# Patient Record
Sex: Female | Born: 1990 | Race: Black or African American | Hispanic: No | Marital: Single | Smoking: Current some day smoker
Health system: Southern US, Community
[De-identification: ages and names within clinical notes are randomized; demographics above are authoritative.]

## PROBLEM LIST (undated history)

## (undated) DIAGNOSIS — R0602 Shortness of breath: Secondary | ICD-10-CM

## (undated) DIAGNOSIS — B351 Tinea unguium: Secondary | ICD-10-CM

## (undated) DIAGNOSIS — M79676 Pain in unspecified toe(s): Secondary | ICD-10-CM

## (undated) DIAGNOSIS — M25559 Pain in unspecified hip: Secondary | ICD-10-CM

## (undated) DIAGNOSIS — R42 Dizziness and giddiness: Secondary | ICD-10-CM

## (undated) HISTORY — DX: Pain in unspecified toe(s): M79.676

## (undated) HISTORY — DX: Tinea unguium: B35.1

## (undated) HISTORY — DX: Pain in unspecified hip: M25.559

## (undated) HISTORY — DX: Dizziness and giddiness: R42

## (undated) HISTORY — DX: Shortness of breath: R06.02

---
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
## 2011-07-20 LAB — URINALYSIS, ROUTINE W REFLEX MICROSCOPIC
Bilirubin Urine: NEGATIVE
Glucose, UA: NEGATIVE
Hgb urine dipstick: NEGATIVE
Leukocytes, UA: NEGATIVE
Protein, ur: 30 — AB
pH: 6

## 2011-09-29 ENCOUNTER — Emergency Department (HOSPITAL_COMMUNITY)
Admission: EM | Admit: 2011-09-29 | Discharge: 2011-09-29 | Disposition: A | Discharge status: Home / Self Care | Payer: Medicaid Other | Attending: Emergency Medicine | Admitting: Emergency Medicine

## 2011-09-29 ENCOUNTER — Encounter: Payer: Self-pay | Admitting: Emergency Medicine

## 2011-09-29 ENCOUNTER — Emergency Department (HOSPITAL_COMMUNITY): Payer: Medicaid Other

## 2011-09-29 DIAGNOSIS — S92919A Unspecified fracture of unspecified toe(s), initial encounter for closed fracture: Secondary | ICD-10-CM | POA: Insufficient documentation

## 2011-09-29 DIAGNOSIS — M79609 Pain in unspecified limb: Secondary | ICD-10-CM | POA: Insufficient documentation

## 2011-09-29 DIAGNOSIS — W2209XA Striking against other stationary object, initial encounter: Secondary | ICD-10-CM | POA: Insufficient documentation

## 2011-09-29 MED ORDER — TRAMADOL HCL 50 MG PO TABS: 50.0000 mg | ORAL_TABLET | Freq: Four times a day (QID) | ORAL | Status: AC | PRN

## 2011-09-29 MED ORDER — HYDROCODONE-ACETAMINOPHEN 5-325 MG PO TABS
1.0000 | ORAL_TABLET | Freq: Once | ORAL | Status: AC
  Administered 2011-09-29: 1 via ORAL
  Filled 2011-09-29: qty 1

## 2011-09-29 NOTE — ED Notes (Signed)
Pt c/o left 4th toe pain; states that she stubbed her toe appx 30 mins pta and the nail split and ripped back

## 2011-09-29 NOTE — ED Notes (Signed)
Patient transported to X-ray 

## 2011-09-29 NOTE — ED Provider Notes (Signed)
History     CSN: [id number]  Arrival date & time 09/29/11  1515   First MD Initiated Contact with Patient 09/29/11 1540     4:13 PM HPI Patient reports she "stumped" her left 4th toe on a wall. 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 States that when her toe hit the wall her deformed toenail bent backwards and bled a little. States painful to ambulate and palpate toe.  Patient is a 20 y.o. female presenting with toe pain. The history is provided by the patient.  Toe Pain This is a new problem. The current episode started today. The problem occurs constantly. The problem has been unchanged. Pertinent negatives include no joint swelling, numbness or weakness. The symptoms are aggravated by walking and standing (palpation). She has tried nothing for the symptoms.    History reviewed. No pertinent past medical history.  History reviewed. No pertinent past surgical history.  No family history on file.  History  Substance Use Topics  . Smoking status: Never Smoker   . Smokeless tobacco: Not on file  . Alcohol Use: Yes     occasionally    OB History    Grav Para Term Preterm Abortions TAB SAB Ect Mult Living                  Review of Systems  Musculoskeletal: Negative for joint swelling.       Toe pain   Skin: Negative for color change and wound.  Neurological: Negative for weakness and numbness.  All other systems reviewed and are negative.    Allergies  Review of patient's allergies indicates no known allergies.  Home Medications  No current outpatient prescriptions on file.  BP 124/70  Pulse 80  Temp(Src) 97.9 F (36.6 C) (Oral)  Resp 16  SpO2 100%  LMP 09/19/2011  Physical Exam  Vitals reviewed. Constitutional: She is oriented to person, place, and time. Vital signs are normal. She appears well-developed and well-nourished. No distress.  HENT:  Head: Normocephalic and atraumatic.  Eyes: Pupils are equal, round, and reactive to light.  Neck:  Neck supple.  Pulmonary/Chest: Effort normal.  Musculoskeletal: Normal range of motion. She exhibits no edema.       Left 4th toe. Full ROM. TTP. Toenail is long and may have a fungal infection, however patient states her toenail has been like this since she was born. Unable to assess cap refill due to discoloration of toenail (that pt states is normal)  Neurological: She is alert and oriented to person, place, and time.  Skin: Skin is warm and dry. No rash noted. No erythema. No pallor.  Psychiatric: She has a normal mood and affect. Her behavior is normal.    ED Course  Procedures   MDM   Possible tuft fracture. Will place in postop shoe and refer to ortho if needed. Patient and family agree on plan      Thomasene Lot, [place] 09/29/11 [phone]

## 2011-09-30 NOTE — ED Provider Notes (Signed)
Medical screening examination/treatment/procedure(s) were performed by non-physician practitioner and as supervising physician I was immediately available for consultation/collaboration.   Glynn Octave, MD 09/30/11 0100

## 2012-01-04 ENCOUNTER — Other Ambulatory Visit: Payer: Self-pay

## 2012-01-04 ENCOUNTER — Emergency Department (HOSPITAL_COMMUNITY)
Admission: EM | Admit: 2012-01-04 | Discharge: 2012-01-04 | Disposition: A | Discharge status: Home / Self Care | Payer: Medicaid Other | Attending: Emergency Medicine | Admitting: Emergency Medicine

## 2012-01-04 ENCOUNTER — Emergency Department (HOSPITAL_COMMUNITY): Payer: Medicaid Other

## 2012-01-04 ENCOUNTER — Encounter (HOSPITAL_COMMUNITY): Payer: Self-pay | Admitting: *Deleted

## 2012-01-04 DIAGNOSIS — R079 Chest pain, unspecified: Secondary | ICD-10-CM | POA: Insufficient documentation

## 2012-01-04 DIAGNOSIS — R000 Tachycardia, unspecified: Secondary | ICD-10-CM | POA: Insufficient documentation

## 2012-01-04 DIAGNOSIS — R42 Dizziness and giddiness: Secondary | ICD-10-CM | POA: Insufficient documentation

## 2012-01-04 DIAGNOSIS — R0602 Shortness of breath: Secondary | ICD-10-CM | POA: Insufficient documentation

## 2012-01-04 NOTE — ED Notes (Signed)
After vitals, I made RN Morrie Sheldon aware she was ready to be seen and if an EKG was needed due to complaint. RN Morrie Sheldon advised she did not think so due to symptoms but to was going to triage her first and then let me know if an EKG was appropriate or not.

## 2012-01-04 NOTE — ED Notes (Signed)
Pt is up laughing in the room pt state at the other hospital pt states her heart is ok she wants lab work

## 2012-01-04 NOTE — ED Notes (Signed)
Pt.'s mother approached nurses' station to ask for "all the names of those who have taken care of my daughter." I advised that I was not able to provide that information but was once again happy to call my charge nurse. She did not understand why I was not able to get everyone's names out. I went ahead and called Diane, the charge nurse.

## 2012-01-04 NOTE — ED Provider Notes (Signed)
Medical screening examination/treatment/procedure(s) were performed by non-physician practitioner and as supervising physician I was immediately available for consultation/collaboration.   Emmanuella Mirante, MD 01/04/12 2101 

## 2012-01-04 NOTE — ED Provider Notes (Signed)
History     CSN: [id number]  Arrival date & time 01/04/12  1336   First MD Initiated Contact with Patient 01/04/12 1506      Chief Complaint  Patient presents with  . Dizziness  . Tachycardia    (Consider location/radiation/quality/duration/timing/severity/associated sxs/prior treatment) Patient is a 21 y.o. female presenting with chest pain. The history is provided by the patient and a parent.  Chest Pain Primary symptoms include shortness of breath, palpitations and dizziness. Pertinent negatives for primary symptoms include no fever and no fatigue.  The palpitations also occurred with dizziness and shortness of breath.   Dizziness does not occur with diaphoresis.   Pertinent negatives for associated symptoms include no diaphoresis.   Pt states she has had intermittent chest pain for the last 7 years. States they occur randomly, at rest. Denies pain when she is playing sports or when walking up the stairs. States when she gets pain, she gets short of breath, Her body states shaking, she gets dizzy. States symptoms last about a day, resolve on its own. Last episode 2 days ago. States she was seen in ER, had ecg done, which was negative. Was discharged home. Mother states " I need to know what is causing her pain in her heart, I am not comfortable sending her home." Pt denies current pain. Denies recent surgeries or travel. Denis cigaret smoking. Not on birth control. Denies being pregnant.    History reviewed. No pertinent past medical history.  History reviewed. No pertinent past surgical history.  No family history on file.  History  Substance Use Topics  . Smoking status: Never Smoker   . Smokeless tobacco: Not on file  . Alcohol Use: Yes     occasionally    OB History    Grav Para Term Preterm Abortions TAB SAB Ect Mult Living                  Review of Systems  Constitutional: Negative for fever, chills, diaphoresis and fatigue.  HENT: Negative.   Eyes:  Negative.   Respiratory: Positive for chest tightness and shortness of breath.   Cardiovascular: Positive for chest pain and palpitations. Negative for leg swelling.  Genitourinary: Negative.   Musculoskeletal: Negative.   Skin: Negative.   Neurological: Positive for dizziness.  Psychiatric/Behavioral: Negative.     Allergies  Review of patient's allergies indicates no known allergies.  Home Medications  No current outpatient prescriptions on file.  BP 125/70  Pulse 102  Temp(Src) 98.6 F (37 C) (Oral)  Resp 16  SpO2 100%  LMP 01/04/2012  Physical Exam  Nursing note and vitals reviewed. Constitutional: She is oriented to person, place, and time. She appears well-developed and well-nourished. No distress.  HENT:  Head: Normocephalic.  Eyes: Conjunctivae are normal.  Neck: Neck supple.  Cardiovascular: Normal rate, regular rhythm and normal heart sounds.   Pulmonary/Chest: Effort normal and breath sounds normal. No respiratory distress. She has no wheezes.  Abdominal: Soft. Bowel sounds are normal. She exhibits no distension. There is no tenderness.  Musculoskeletal: Normal range of motion. She exhibits no edema.  Neurological: She is alert and oriented to person, place, and time.  Skin: Skin is warm and dry.  Psychiatric: She has a normal mood and affect.    ED Course  Procedures (including critical care time)  Pt on exam not tachycardic, smiling, laughing, I do not think this is PE, bc pain free at present, normal Oxygen sat,  On my exam not tachycardic.  Pt low risk for a ACS. Will get ECG, CXR. Mother stated multiple times "I need to know what is wrong with my daughters heart." I went in with nurse Gar Gibbon to explain to mother that pt's pain has been there for 7 years, she needs to follow up with her primary care doctor after we get ECG and CXR done here and if they are normal. Explained that there are other things in Pt's chest that could be causeing her pain. Mother  stated "That is impossible... " To me and Gar Gibbon, She said " There is nothing in the left side of the chest other then her heart."  Did some pt education, which I guess pt did not take very well, she became angry, but agreed to get tests done here.    Date: 01/04/2012  Rate: 87  Rhythm: normal sinus rhythm  QRS Axis: normal  Intervals: normal  ST/T Wave abnormalities: normal  Conduction Disutrbances:none  Narrative Interpretation:   Old EKG Reviewed: none available  Dg Chest 2 View  01/04/2012  *RADIOLOGY REPORT*  Clinical Data: Dizziness and tachycardia  CHEST - 2 VIEW  Comparison: None.  Findings: Normal heart size.  Clear lungs.  No pneumothorax and no pleural effusion.  IMPRESSION: No active cardiopulmonary disease.  Original Report Authenticated By: Donavan Burnet, M.D.    4:20 PM CXR negative. ECG with no acute findings. Pt is currently cp free. Doubt PE PERC negative. I do think given pt's symptoms, she will need close follow up and more testing. Will refer to cardiology per mothers request. Mother reassured.   No diagnosis found.    MDM          Lottie Mussel, PA 01/04/12 1621

## 2012-01-04 NOTE — ED Notes (Signed)
Patient is resting comfortably. Denies pain at present. Reports chest pain, intermittent x 7 years. Last episode of chest pan 2 days ago. Mother very anxious about child having heart problem

## 2012-01-04 NOTE — Discharge Instructions (Signed)
Kylie Anderson's ECG and chest x-ray are normal. If pain recurs, try deep breathing, take tylenol/motrin for pain. If becomes severe seek medical help. Follow up with either primary care doctor or cardiologist as referred for further testsing and for Echo and Holter's monitor test as they seem appropriate.   Chest Pain (Nonspecific) It is often hard to give a specific diagnosis for the cause of chest pain. There is always a chance that your pain could be related to something serious, such as a heart attack or a blood clot in the lungs. You need to follow up with your caregiver for further evaluation. CAUSES   Heartburn.   Pneumonia or bronchitis.   Anxiety or stress.   Inflammation around your heart (pericarditis) or lung (pleuritis or pleurisy).   A blood clot in the lung.   A collapsed lung (pneumothorax). It can develop suddenly on its own (spontaneous pneumothorax) or from injury (trauma) to the chest.   Shingles infection (herpes zoster virus).  The chest wall is composed of bones, muscles, and cartilage. Any of these can be the source of the pain.  The bones can be bruised by injury.   The muscles or cartilage can be strained by coughing or overwork.   The cartilage can be affected by inflammation and become sore (costochondritis).  DIAGNOSIS  Lab tests or other studies, such as X-rays, electrocardiography, stress testing, or cardiac imaging, may be needed to find the cause of your pain.  TREATMENT   Treatment depends on what may be causing your chest pain. Treatment may include:   Acid blockers for heartburn.   Anti-inflammatory medicine.   Pain medicine for inflammatory conditions.   Antibiotics if an infection is present.   You may be advised to change lifestyle habits. This includes stopping smoking and avoiding alcohol, caffeine, and chocolate.   You may be advised to keep your head raised (elevated) when sleeping. This reduces the chance of acid going backward from  your stomach into your esophagus.   Most of the time, nonspecific chest pain will improve within 2 to 3 days with rest and mild pain medicine.  HOME CARE INSTRUCTIONS   If antibiotics were prescribed, take your antibiotics as directed. Finish them even if you start to feel better.   For the next few days, avoid physical activities that bring on chest pain. Continue physical activities as directed.   Do not smoke.   Avoid drinking alcohol.   Only take over-the-counter or prescription medicine for pain, discomfort, or fever as directed by your caregiver.   Follow your caregiver's suggestions for further testing if your chest pain does not go away.   Keep any follow-up appointments you made. If you do not go to an appointment, you could develop lasting (chronic) problems with pain. If there is any problem keeping an appointment, you must call to reschedule.  SEEK MEDICAL CARE IF:   You think you are having problems from the medicine you are taking. Read your medicine instructions carefully.   Your chest pain does not go away, even after treatment.   You develop a rash with blisters on your chest.  SEEK IMMEDIATE MEDICAL CARE IF:   You have increased chest pain or pain that spreads to your arm, neck, jaw, back, or abdomen.   You develop shortness of breath, an increasing cough, or you are coughing up blood.   You have severe back or abdominal pain, feel nauseous, or vomit.   You develop severe weakness, fainting, or chills.  You have a fever.  THIS IS AN EMERGENCY. Do not wait to see if the pain will go away. Get medical help at once. Call your local emergency services (911 in [place] Do not drive yourself to the hospital. MAKE SURE YOU:   Understand these instructions.   Will watch your condition.   Will get help right away if you are not doing well or get worse.  Document Released: 06/29/2005 Document Revised: 09/08/2011 Document Reviewed: 04/24/2008 [facility] Patient  Information 2012 [place], [place].  RESOURCE GUIDE  Dental Problems  Patients with Medicaid: [facility] [phone]                                           [phone] [address] Phone:  [phone]                                                  Phone:  [phone]  If unable to pay or uninsured, contact:  [facility] or [facility] to become qualified for the adult dental clinic.  Chronic Pain Problems Contact [facility]  [phone] Patients need to be referred by their primary care doctor.  Insufficient Money for Medicine Contact [employer]:  call "211" or [facility] [phone].  No Primary Care Doctor Call [facility]  [phone] Other agencies that provide inexpensive medical care    [facility]  [phone]    [facility]  6197317628    [facility]  [phone]    [facility]  5015843308    [facility]  [phone]    [facility]  [phone]  Psychological Services [facility]  [phone] [facility] Services  [phone] [facility]   [phone] (emergency services [phone])  Substance Abuse Resources [facility] Drug Services  [phone] [facility] (319)697-9080 [facility] [place] (662)828-1855 Floyd[facility] Flock (276)633-7061 Residential & Outpatient Substance Abuse Program  956-031-3248  Abuse/Neglect [facility] Child Abuse Hotline 2482231359 [facility] Child Abuse Hotline (905) 509-8335 (After Hours)  Emergency Shelter [facility] 857-524-6643  Maternity Homes Room at the [place] [facility] [place] (646)317-2736 [facility] (404)134-9401  MRSA Hotline #:   980-381-7434    [facility] Resources  [facility] [place]     [employer]                          [facility] Dept. [address] [place]                        [address]      [address]  Patrecia Pace  [facility] Phone:  [phone]                                   Phone:  [phone]                 Phone:  [place] Phone:  [place] [phone] (After Hours)

## 2012-01-04 NOTE — ED Notes (Signed)
During assessment, patient's mother and visitor requested to know more information about her care. I advised her that I was not aware of her current situation because she was not on my patient list but that I was more than happy to get the nurse and PA. She continued to be verbally aggressive demanding to know why I would not answer questions. I continued to reminder her I was not able to give information because I was not providing care, I was merely there to perform the EKG task. I concluded the conversation that I was more than happy to get the assigned nurse, PA assigned, and even the charge nurse. It seemed I was not able to please patient's mother regardless of my response.

## 2012-01-04 NOTE — ED Notes (Signed)
Pt states she was in college 2 days ago and started to feel dizzy and heart racing. Pt states she went to hospital 2days at the hospital and was dx with anxiety . Pt denies any symptoms at this time. Pt states she just want to be checked out

## 2012-02-13 ENCOUNTER — Ambulatory Visit: Payer: Medicaid Other | Admitting: Cardiology

## 2012-03-22 ENCOUNTER — Ambulatory Visit: Payer: Medicaid Other | Admitting: Cardiology

## 2012-05-07 ENCOUNTER — Emergency Department (HOSPITAL_COMMUNITY)
Admission: EM | Admit: 2012-05-07 | Discharge: 2012-05-07 | Disposition: A | Discharge status: Home Health | Payer: Medicaid Other | Attending: Emergency Medicine | Admitting: Emergency Medicine

## 2012-05-07 ENCOUNTER — Emergency Department (HOSPITAL_COMMUNITY): Payer: Medicaid Other

## 2012-05-07 ENCOUNTER — Encounter (HOSPITAL_COMMUNITY): Payer: Self-pay

## 2012-05-07 DIAGNOSIS — F411 Generalized anxiety disorder: Secondary | ICD-10-CM | POA: Insufficient documentation

## 2012-05-07 DIAGNOSIS — R079 Chest pain, unspecified: Secondary | ICD-10-CM | POA: Insufficient documentation

## 2012-05-07 DIAGNOSIS — M940 Chondrocostal junction syndrome [Tietze]: Secondary | ICD-10-CM | POA: Insufficient documentation

## 2012-05-07 LAB — CBC WITH DIFFERENTIAL/PLATELET
Basophils Relative: 1 % (ref 0–1)
Eosinophils Absolute: 0.1 10*3/uL (ref 0.0–0.7)
HCT: 40.1 % (ref 36.0–46.0)
Hemoglobin: 13.2 g/dL (ref 12.0–15.0)
Lymphs Abs: 2.8 10*3/uL (ref 0.7–4.0)
MCH: 28.7 pg (ref 26.0–34.0)
MCHC: 32.9 g/dL (ref 30.0–36.0)
MCV: 87.2 fL (ref 78.0–100.0)
Monocytes Absolute: 0.4 10*3/uL (ref 0.1–1.0)
Monocytes Relative: 7 % (ref 3–12)
Neutrophils Relative %: 40 % — ABNORMAL LOW (ref 43–77)
RBC: 4.6 MIL/uL (ref 3.87–5.11)

## 2012-05-07 LAB — POCT I-STAT, CHEM 8
Calcium, Ion: 1.29 mmol/L — ABNORMAL HIGH (ref 1.12–1.23)
Chloride: 104 mEq/L (ref 96–112)
Creatinine, Ser: 1 mg/dL (ref 0.50–1.10)
Glucose, Bld: 72 mg/dL (ref 70–99)
Hemoglobin: 14.3 g/dL (ref 12.0–15.0)
Potassium: 4.2 mEq/L (ref 3.5–5.1)

## 2012-05-07 NOTE — ED Notes (Signed)
Pt compalains of chest pain, on going sicne she started college, pt sts may be anxiety.

## 2012-05-07 NOTE — ED Provider Notes (Signed)
History   This chart was scribed for Cheri Guppy, MD by Shari Heritage. The patient was seen in room TR04C/TR04C. Patient's care was started at 1253.     CSN: [id number]  Arrival date & time 05/07/12  1253   First MD Initiated Contact with Patient 05/07/12 1651      Chief Complaint  Patient presents with  . Chest Pain  . Anxiety    (Consider location/radiation/quality/duration/timing/severity/associated sxs/prior treatment) HPI  Kylie Anderson is a 21 y.o. female who presents to the Emergency Department complaining of chest pain onset several hours ago. Patient describes the pain as pointy, sharp and heavy. Pain is now resolved. Associated symptoms include labored breathing and body shakes. Patient says that she has had these pain episodes intermittently since she was 21 years old. Patient denies cough, fever, or chills. There is no nausea or vomiting. No leg swelling or leg pain. Patient denies any recent travel or long car trips. Patient denies any history of heart problems. Patient says that her grandfather had similar pains, but was never diagnosed with a specific condition.   Past Medical History  Diagnosis Date  . Hip pain   . Toe pain   . Fungal infection of toenail   . Chest pain   . SOB (shortness of breath)   . Dizziness     No past surgical history on file.  No family history on file.  History  Substance Use Topics  . Smoking status: Never Smoker   . Smokeless tobacco: Not on file  . Alcohol Use: Yes     occasionally    OB History    Grav Para Term Preterm Abortions TAB SAB Ect Mult Living                  Review of Systems  Constitutional: Negative for fever and chills.  Respiratory: Positive for shortness of breath. Negative for cough.   Cardiovascular: Positive for chest pain. Negative for leg swelling.  Gastrointestinal: Negative for nausea and vomiting.    Allergies  Review of patient's allergies indicates no known allergies.  Home  Medications  No current outpatient prescriptions on file.  BP 106/70  Pulse 74  Temp 98.2 F (36.8 C) (Oral)  Resp 18  SpO2 99%  LMP 04/29/2012  Physical Exam  Cardiovascular: Normal rate and regular rhythm.   No murmur heard.      Pulses are normal.  Pulmonary/Chest: Effort normal and breath sounds normal. No respiratory distress. She has no wheezes. She has no rales.       Left parasternal, mild tenderness.    ED Course  Procedures (including critical care time) DIAGNOSTIC STUDIES: Oxygen Saturation is 99% on room air, normal by my interpretation.    COORDINATION OF CARE: 5:04pm- Patient informed of current plan for treatment and evaluation and agrees with plan at this time.    Labs Reviewed  CBC WITH DIFFERENTIAL - Abnormal; Notable for the following:    Neutrophils Relative 40 (*)     Lymphocytes Relative 51 (*)     All other components within normal limits  POCT I-STAT, CHEM 8 - Abnormal; Notable for the following:    Calcium, Ion 1.29 (*)     All other components within normal limits  POCT I-STAT TROPONIN I   Dg Chest 2 View  05/07/2012  *RADIOLOGY REPORT*  Clinical Data: Intermittent chest pain.  CHEST - 2 VIEW  Comparison: 01/31/2012.  Findings:  Cardiopericardial silhouette within normal limits. Mediastinal contours normal. Trachea  midline.  No airspace disease or effusion.  IMPRESSION: No active cardiopulmonary disease.  No interval change.  Original Report Authenticated By: Andreas Newport, M.D.     No diagnosis found.  ECG Normal sinus rhythm at 86 beats per minute. Normal axis. Normal intervals. Impression normal sinus rhythm with sinus arrhythmia  MDM  costochondritis      I personally performed the services described in this documentation, which was scribed in my presence. The recorded information has been reviewed and considered.     Cheri Guppy, MD 05/07/12 [phone]

## 2013-01-12 ENCOUNTER — Encounter (HOSPITAL_COMMUNITY): Payer: Self-pay | Admitting: Emergency Medicine

## 2013-01-12 ENCOUNTER — Emergency Department (HOSPITAL_COMMUNITY)
Admission: EM | Admit: 2013-01-12 | Discharge: 2013-01-12 | Disposition: A | Discharge status: Home / Self Care | Payer: Self-pay | Attending: Emergency Medicine | Admitting: Emergency Medicine

## 2013-01-12 ENCOUNTER — Emergency Department (HOSPITAL_COMMUNITY): Payer: Self-pay

## 2013-01-12 DIAGNOSIS — F17200 Nicotine dependence, unspecified, uncomplicated: Secondary | ICD-10-CM | POA: Insufficient documentation

## 2013-01-12 DIAGNOSIS — R071 Chest pain on breathing: Secondary | ICD-10-CM | POA: Insufficient documentation

## 2013-01-12 DIAGNOSIS — Z872 Personal history of diseases of the skin and subcutaneous tissue: Secondary | ICD-10-CM | POA: Insufficient documentation

## 2013-01-12 DIAGNOSIS — R0789 Other chest pain: Secondary | ICD-10-CM

## 2013-01-12 DIAGNOSIS — Z8679 Personal history of other diseases of the circulatory system: Secondary | ICD-10-CM | POA: Insufficient documentation

## 2013-01-12 DIAGNOSIS — Z8709 Personal history of other diseases of the respiratory system: Secondary | ICD-10-CM | POA: Insufficient documentation

## 2013-01-12 DIAGNOSIS — Z8739 Personal history of other diseases of the musculoskeletal system and connective tissue: Secondary | ICD-10-CM | POA: Insufficient documentation

## 2013-01-12 DIAGNOSIS — Z3202 Encounter for pregnancy test, result negative: Secondary | ICD-10-CM | POA: Insufficient documentation

## 2013-01-12 DIAGNOSIS — Z8669 Personal history of other diseases of the nervous system and sense organs: Secondary | ICD-10-CM | POA: Insufficient documentation

## 2013-01-12 LAB — URINE MICROSCOPIC-ADD ON

## 2013-01-12 LAB — URINALYSIS, ROUTINE W REFLEX MICROSCOPIC
Bilirubin Urine: NEGATIVE
Glucose, UA: NEGATIVE mg/dL
Hgb urine dipstick: NEGATIVE
Ketones, ur: NEGATIVE mg/dL
Nitrite: NEGATIVE
Protein, ur: NEGATIVE mg/dL
Specific Gravity, Urine: 1.021 (ref 1.005–1.030)
Urobilinogen, UA: 1 mg/dL (ref 0.0–1.0)
pH: 6.5 (ref 5.0–8.0)

## 2013-01-12 LAB — POCT I-STAT, CHEM 8
Chloride: 107 mEq/L (ref 96–112)
HCT: 40 % (ref 36.0–46.0)
Hemoglobin: 13.6 g/dL (ref 12.0–15.0)
Potassium: 3.9 mEq/L (ref 3.5–5.1)
Sodium: 139 mEq/L (ref 135–145)

## 2013-01-12 LAB — D-DIMER, QUANTITATIVE: D-Dimer, Quant: 0.41 ug/mL-FEU (ref 0.00–0.48)

## 2013-01-12 LAB — PREGNANCY, URINE: Preg Test, Ur: NEGATIVE

## 2013-01-12 MED ORDER — KETOROLAC TROMETHAMINE 60 MG/2ML IM SOLN
60.0000 mg | Freq: Once | INTRAMUSCULAR | Status: AC
  Administered 2013-01-12: 60 mg via INTRAMUSCULAR
  Filled 2013-01-12: qty 2

## 2013-01-12 MED ORDER — IBUPROFEN 800 MG PO TABS: 800.0000 mg | ORAL_TABLET | Freq: Three times a day (TID) | ORAL | Status: DC | PRN

## 2013-01-12 MED ORDER — HYDROCODONE-ACETAMINOPHEN 5-325 MG PO TABS
1.0000 | ORAL_TABLET | Freq: Once | ORAL | Status: AC
  Administered 2013-01-12: 1 via ORAL
  Filled 2013-01-12: qty 1

## 2013-01-12 MED ORDER — HYDROCODONE-ACETAMINOPHEN 5-325 MG PO TABS: 1.0000 | ORAL_TABLET | Freq: Four times a day (QID) | ORAL | Status: DC | PRN

## 2013-01-12 NOTE — ED Provider Notes (Signed)
History     CSN: [id number]  Arrival date & time 01/12/13  1409   First MD Initiated Contact with Patient 01/12/13 [phone]      Chief Complaint  Patient presents with  . Chest Pain    (Consider location/radiation/quality/duration/timing/severity/associated sxs/prior treatment) HPI  The patient presents to the emergency department with left-sided chest pain.  Patient, states, that she woke up this morning with the pain.  Patient, states, that she's had this type of pain in the past and was seen in the emergency department.  Patient, states she occasionally smokes, but not on a consistent basis.  Patient denies shortness of breath, headache, abdominal pain, nausea, vomiting, diarrhea, blurred vision, neck pain, dizziness, or syncope.  Patient, states, that she doesn't understand why we can't tell her what is causing her pain.  Patient, states she has had this type of chest pain in the past Past Medical History  Diagnosis Date  . Hip pain   . Toe pain   . Fungal infection of toenail   . Chest pain   . SOB (shortness of breath)   . Dizziness     History reviewed. No pertinent past surgical history.  No family history on file.  History  Substance Use Topics  . Smoking status: Current Every Day Smoker  . Smokeless tobacco: Not on file  . Alcohol Use: Yes     Comment: occasionally    OB History   Grav Para Term Preterm Abortions TAB SAB Ect Mult Living                  Review of Systems All other systems negative except as documented in the HPI. All pertinent positives and negatives as reviewed in the HPI. Allergies  Review of patient's allergies indicates no known allergies.  Home Medications   Current Outpatient Rx  Name  Route  Sig  Dispense  Refill  . ibuprofen (ADVIL,MOTRIN) 200 MG tablet   Oral   Take 200 mg by mouth every 6 (six) hours as needed for pain.           BP 122/78  Pulse 89  Temp(Src) 98.6 F (37 C) (Oral)  Resp 15  SpO2 100%  LMP  12/17/2012  Physical Exam  Nursing note and vitals reviewed. Constitutional: She is oriented to person, place, and time. She appears well-developed and well-nourished.  HENT:  Head: Normocephalic and atraumatic.  Mouth/Throat: Oropharynx is clear and moist.  Eyes: Pupils are equal, round, and reactive to light.  Neck: Normal range of motion. Neck supple.  Cardiovascular: Normal rate, regular rhythm and normal heart sounds.  Exam reveals no gallop and no friction rub.   No murmur heard. Pulmonary/Chest: Effort normal and breath sounds normal. No respiratory distress. She exhibits tenderness.  Abdominal: Soft. Bowel sounds are normal. She exhibits no distension. There is no tenderness. There is no guarding.  Neurological: She is alert and oriented to person, place, and time. Coordination normal.  Skin: Skin is warm and dry. No rash noted.    ED Course  Procedures (including critical care time)  Labs Reviewed  URINALYSIS, ROUTINE W REFLEX MICROSCOPIC - Abnormal; Notable for the following:    APPearance CLOUDY (*)    Leukocytes, UA SMALL (*)    All other components within normal limits  URINE MICROSCOPIC-ADD ON - Abnormal; Notable for the following:    Squamous Epithelial / LPF FEW (*)    Bacteria, UA MANY (*)    All other components within normal  limits  URINE CULTURE  PREGNANCY, URINE  D-DIMER, QUANTITATIVE  POCT I-STAT, CHEM 8   Dg Chest 2 View  01/12/2013  *RADIOLOGY REPORT*  Clinical Data: Pain in the chest.  CHEST - 2 VIEW  Comparison: 05/07/2012  Findings: Two views of the chest demonstrate clear lungs.  Heart and mediastinum are within normal limits.  The trachea is midline. Bony thorax is intact.  IMPRESSION: No active cardiopulmonary disease.   Original Report Authenticated By: Richarda Overlie, M.D.    Patient be treated for chest wall pain, based on her history of present illness , and physical exam findings.  Patient has been seen in the past for similar chest pain.  I went  and advised the patient of her test results, and she is irritated, that we cannot clearly define, why she's having chest pain, other than telling her that it is her chest wall.  The mother is in the room and asked that we refer her to a cardiologist.  Patient does not have any cardiac risk factors and is PERC negative.     MDM  MDM Reviewed: vitals and nursing note Interpretation: labs, x-ray and ECG           Carlyle Dolly, PA-C 01/14/13 0116

## 2013-01-12 NOTE — ED Notes (Signed)
Pt c/o of centralized chest pain that has been occuring since she was 22 y/o. States that she has gone to numerous MDs, unable to determine cause. Thinks it may be stress related. SOB upon onset. NAD at this time.

## 2013-01-13 LAB — URINE CULTURE

## 2013-01-14 NOTE — ED Provider Notes (Signed)
Medical screening examination/treatment/procedure(s) were performed by non-physician practitioner and as supervising physician I was immediately available for consultation/collaboration.  Teyona Nichelson R. Scott Vanderveer, MD 01/14/13 0300 

## 2013-09-08 ENCOUNTER — Encounter (HOSPITAL_COMMUNITY): Payer: Self-pay | Admitting: Emergency Medicine

## 2013-09-08 ENCOUNTER — Emergency Department (HOSPITAL_COMMUNITY)
Admission: EM | Admit: 2013-09-08 | Discharge: 2013-09-08 | Disposition: A | Discharge status: Home / Self Care | Payer: Medicaid Other | Attending: Emergency Medicine | Admitting: Emergency Medicine

## 2013-09-08 DIAGNOSIS — T148 Other injury of unspecified body region: Secondary | ICD-10-CM | POA: Insufficient documentation

## 2013-09-08 DIAGNOSIS — Y939 Activity, unspecified: Secondary | ICD-10-CM | POA: Insufficient documentation

## 2013-09-08 DIAGNOSIS — F17200 Nicotine dependence, unspecified, uncomplicated: Secondary | ICD-10-CM | POA: Insufficient documentation

## 2013-09-08 DIAGNOSIS — Y929 Unspecified place or not applicable: Secondary | ICD-10-CM | POA: Insufficient documentation

## 2013-09-08 DIAGNOSIS — W57XXXA Bitten or stung by nonvenomous insect and other nonvenomous arthropods, initial encounter: Secondary | ICD-10-CM | POA: Insufficient documentation

## 2013-09-08 DIAGNOSIS — L299 Pruritus, unspecified: Secondary | ICD-10-CM | POA: Insufficient documentation

## 2013-09-08 MED ORDER — LORATADINE 10 MG PO TABS: 10.0000 mg | ORAL_TABLET | Freq: Every day | ORAL | Status: DC

## 2013-09-08 MED ORDER — PREDNISONE 20 MG PO TABS
60.0000 mg | ORAL_TABLET | Freq: Once | ORAL | Status: AC
  Administered 2013-09-08: 60 mg via ORAL
  Filled 2013-09-08: qty 3

## 2013-09-08 MED ORDER — HYDROXYZINE HCL 10 MG PO TABS: 10.0000 mg | ORAL_TABLET | Freq: Three times a day (TID) | ORAL | Status: DC | PRN

## 2013-09-08 MED ORDER — PREDNISONE 10 MG PO TABS: 20.0000 mg | ORAL_TABLET | Freq: Every day | ORAL | Status: DC

## 2013-09-08 NOTE — ED Provider Notes (Signed)
CSN: [id number]     Arrival date & time 09/08/13  1935 History  This chart was scribed for Marlon Pel, PA-C, working with Vida Roller, MD by Blanchard Kelch, ED Scribe. This patient was seen in room TR07C/TR07C and the patient's care was started at 8:10 PM.    Chief Complaint  Patient presents with  . Insect Bite    The history is provided by the patient. No language interpreter was used.    HPI Comments: Kylie Anderson is a 22 y.o. female who presents to the Emergency Department complaining of an insect bite to her right hand that occurred three days ago. She is complaining of burning pain to the associated area. She also reports itching sensation that has spread to her entire body over the past few days. She has been taking two Benadryl with temporary relief for an hour. Her last dose was about eight hours ago. She denies any wheezing, shortness of breath, throat closing or facial swelling.   Past Medical History  Diagnosis Date  . Hip pain   . Toe pain   . Fungal infection of toenail   . Chest pain   . SOB (shortness of breath)   . Dizziness    History reviewed. No pertinent past surgical history. No family history on file. History  Substance Use Topics  . Smoking status: Current Every Day Smoker  . Smokeless tobacco: Not on file  . Alcohol Use: Yes     Comment: occasionally   OB History   Grav Para Term Preterm Abortions TAB SAB Ect Mult Living                 Review of Systems  HENT: Negative for facial swelling and trouble swallowing.   Respiratory: Negative for shortness of breath and wheezing.   Skin:       Positive for insect bite.  All other systems reviewed and are negative.    Allergies  Review of patient's allergies indicates no known allergies.  Home Medications   Current Outpatient Rx  Name  Route  Sig  Dispense  Refill  . diphenhydrAMINE (BENADRYL) 25 MG tablet   Oral   Take 25 mg by mouth every 6 (six) hours as needed for itching.          . hydrOXYzine (ATARAX/VISTARIL) 10 MG tablet   Oral   Take 1 tablet (10 mg total) by mouth 3 (three) times daily as needed.   30 tablet   0   . loratadine (CLARITIN) 10 MG tablet   Oral   Take 1 tablet (10 mg total) by mouth daily.   14 tablet   0   . predniSONE (DELTASONE) 10 MG tablet   Oral   Take 2 tablets (20 mg total) by mouth daily.   21 tablet   0     Prednisone dose pack directions:   6 tabs on day ...    Triage Vitals: BP 118/71  Pulse 76  Temp(Src) 98.4 F (36.9 C) (Oral)  Resp 16  Ht 5\' 3"  (1.6 m)  Wt 151 lb (68.493 kg)  BMI 26.76 kg/m2  SpO2 99%  LMP 08/27/2013  Physical Exam  Nursing note and vitals reviewed. Constitutional: She is oriented to person, place, and time. She appears well-developed and well-nourished. No distress.  Patient itching during exam.  HENT:  Head: Normocephalic and atraumatic.  Eyes: EOM are normal.  Neck: Neck supple. No tracheal deviation present.  Cardiovascular: Normal rate.   Pulmonary/Chest: Effort  normal. No respiratory distress.  Musculoskeletal: Normal range of motion.  Neurological: She is alert and oriented to person, place, and time.  Skin: Skin is warm and dry.  Small pinpoint red dot on patient's right palm that has no surrounding erythema or induration. It is not painful to touch.  Psychiatric: She has a normal mood and affect. Her behavior is normal.    ED Course  Procedures (including critical care time)  DIAGNOSTIC STUDIES: Oxygen Saturation is 99% on room air, normal by my interpretation.    COORDINATION OF CARE: 8:10 PM -Will order medication to stop allergic reaction. Patient verbalizes understanding and agrees with treatment plan.    Labs Review Labs Reviewed - No data to display Imaging Review No results found.  EKG Interpretation   None       MDM   1. Insect bite   2. Itching    22 y.o.Kylie Anderson's evaluation in the Emergency Department is complete. It has been  determined that no acute conditions requiring further emergency intervention are present at this time. The patient/guardian have been advised of the diagnosis and plan. We have discussed signs and symptoms that warrant return to the ED, such as changes or worsening in symptoms.  Vital signs are stable at discharge. Filed Vitals:   09/08/13 1938  BP: 118/71  Pulse: 76  Temp: 98.4 F (36.9 C)  Resp: 16    Patient/guardian has voiced understanding and agreed to follow-up with the PCP or specialist.  I personally performed the services described in this documentation, which was scribed in my presence. The recorded information has been reviewed and is accurate.    Dorthula Matas, PA-C 09/08/13 2015

## 2013-09-08 NOTE — ED Notes (Signed)
PA Tiffany at bedside.  

## 2013-09-08 NOTE — ED Notes (Signed)
C/o ? Insect bite to R palm that she noticed today. C/o itching all over since Friday.  Denies sob.

## 2013-09-09 ENCOUNTER — Emergency Department (HOSPITAL_COMMUNITY)
Admission: EM | Admit: 2013-09-09 | Discharge: 2013-09-09 | Disposition: A | Discharge status: Home / Self Care | Payer: Medicaid Other | Attending: Emergency Medicine | Admitting: Emergency Medicine

## 2013-09-09 ENCOUNTER — Encounter (HOSPITAL_COMMUNITY): Payer: Self-pay | Admitting: Emergency Medicine

## 2013-09-09 DIAGNOSIS — Y929 Unspecified place or not applicable: Secondary | ICD-10-CM | POA: Insufficient documentation

## 2013-09-09 DIAGNOSIS — IMO0001 Reserved for inherently not codable concepts without codable children: Secondary | ICD-10-CM | POA: Insufficient documentation

## 2013-09-09 DIAGNOSIS — F411 Generalized anxiety disorder: Secondary | ICD-10-CM | POA: Insufficient documentation

## 2013-09-09 DIAGNOSIS — L299 Pruritus, unspecified: Secondary | ICD-10-CM | POA: Insufficient documentation

## 2013-09-09 DIAGNOSIS — Z8619 Personal history of other infectious and parasitic diseases: Secondary | ICD-10-CM | POA: Insufficient documentation

## 2013-09-09 DIAGNOSIS — R450 Nervousness: Secondary | ICD-10-CM | POA: Insufficient documentation

## 2013-09-09 DIAGNOSIS — T6391XA Toxic effect of contact with unspecified venomous animal, accidental (unintentional), initial encounter: Secondary | ICD-10-CM | POA: Insufficient documentation

## 2013-09-09 DIAGNOSIS — Y939 Activity, unspecified: Secondary | ICD-10-CM | POA: Insufficient documentation

## 2013-09-09 DIAGNOSIS — T63481A Toxic effect of venom of other arthropod, accidental (unintentional), initial encounter: Secondary | ICD-10-CM | POA: Insufficient documentation

## 2013-09-09 DIAGNOSIS — F17200 Nicotine dependence, unspecified, uncomplicated: Secondary | ICD-10-CM | POA: Insufficient documentation

## 2013-09-09 DIAGNOSIS — IMO0002 Reserved for concepts with insufficient information to code with codable children: Secondary | ICD-10-CM | POA: Insufficient documentation

## 2013-09-09 DIAGNOSIS — Z79899 Other long term (current) drug therapy: Secondary | ICD-10-CM | POA: Insufficient documentation

## 2013-09-09 MED ORDER — FAMOTIDINE 20 MG PO TABS
20.0000 mg | ORAL_TABLET | Freq: Once | ORAL | Status: AC
  Administered 2013-09-09: 20 mg via ORAL
  Filled 2013-09-09: qty 1

## 2013-09-09 MED ORDER — ACETAMINOPHEN 325 MG PO TABS
650.0000 mg | ORAL_TABLET | Freq: Once | ORAL | Status: AC
  Administered 2013-09-09: 650 mg via ORAL
  Filled 2013-09-09: qty 2

## 2013-09-09 NOTE — ED Notes (Signed)
Pt saw pcp today for allergic reaction--burning all over; given prednisone but did not get it filled; continues with burning and pain

## 2013-09-09 NOTE — ED Notes (Signed)
Pt e-signed d/c in room however computer froze.

## 2013-09-09 NOTE — ED Provider Notes (Signed)
CSN: [id number]     Arrival date & time 09/09/13  0251 History   First MD Initiated Contact with Patient 09/09/13 0325     Chief Complaint  Patient presents with  . Allergic Reaction   (Consider location/radiation/quality/duration/timing/severity/associated sxs/prior Treatment) HPI Comments: Patient was bitten on the hand on Friday than developed generalized itching Denies SOB throat tightening  Was seen at [facility] several hours ago given Prednisone and Vistaril and DC home  With Rx for steroid and Vistaril  Which she has not filled yet  Now wants to be tested for the specific bug that it her and wants her system flushed out   Patient is a 22 y.o. female presenting with allergic reaction. The history is provided by the patient and a parent.  Allergic Reaction Presenting symptoms: itching   Presenting symptoms: no difficulty breathing, no difficulty swallowing, no rash and no wheezing   Severity:  Moderate Prior allergic episodes:  No prior episodes Relieved by:  Nothing Worsened by:  Nothing tried Ineffective treatments:  None tried   Past Medical History  Diagnosis Date  . Hip pain   . Toe pain   . Fungal infection of toenail   . Chest pain   . SOB (shortness of breath)   . Dizziness    History reviewed. No pertinent past surgical history. No family history on file. History  Substance Use Topics  . Smoking status: Current Every Day Smoker  . Smokeless tobacco: Not on file  . Alcohol Use: Yes     Comment: occasionally   OB History   Grav Para Term Preterm Abortions TAB SAB Ect Mult Living                 Review of Systems  Constitutional: Negative for fever and chills.  HENT: Negative for drooling, sore throat and trouble swallowing.   Respiratory: Negative for shortness of breath and wheezing.   Gastrointestinal: Negative for nausea and abdominal pain.  Musculoskeletal: Positive for myalgias.  Skin: Positive for itching. Negative for rash and wound.   Neurological: Negative for dizziness and headaches.  Psychiatric/Behavioral: The patient is nervous/anxious.   All other systems reviewed and are negative.    Allergies  Review of patient's allergies indicates no known allergies.  Home Medications   Current Outpatient Rx  Name  Route  Sig  Dispense  Refill  . diphenhydrAMINE (BENADRYL) 25 MG tablet   Oral   Take 25 mg by mouth every 6 (six) hours as needed for itching.         . hydrOXYzine (ATARAX/VISTARIL) 10 MG tablet   Oral   Take 1 tablet (10 mg total) by mouth 3 (three) times daily as needed.   30 tablet   0   . loratadine (CLARITIN) 10 MG tablet   Oral   Take 1 tablet (10 mg total) by mouth daily.   14 tablet   0   . predniSONE (DELTASONE) 10 MG tablet   Oral   Take 2 tablets (20 mg total) by mouth daily.   21 tablet   0     Prednisone dose pack directions:   6 tabs on day ...    LMP 08/27/2013 Physical Exam  Nursing note and vitals reviewed. Constitutional: She is oriented to person, place, and time. She appears well-developed and well-nourished.  HENT:  Head: Normocephalic.  Mouth/Throat: Oropharynx is clear and moist.  Eyes: Pupils are equal, round, and reactive to light.  Neck: Normal range of motion.  Cardiovascular: Normal rate and regular rhythm.   Pulmonary/Chest: Effort normal and breath sounds normal. She has no wheezes.  Abdominal: Soft. She exhibits no distension.  Musculoskeletal: Normal range of motion.  Lymphadenopathy:    She has no cervical adenopathy.  Neurological: She is alert and oriented to person, place, and time.  Skin: Skin is warm and dry. No rash noted. There is erythema.    ED Course  Procedures (including critical care time) Labs Review Labs Reviewed - No data to display Imaging Review No results found.  EKG Interpretation   None       MDM   1. Allergic reaction to insect sting, subsequent encounter     Allergic reaction Explained there is no test  for a specific bug, she is to try to stay cool, take the medications as directed until completed.     Arman Filter, NP 09/09/13 [phone]

## 2013-09-09 NOTE — ED Provider Notes (Signed)
Medical screening examination/treatment/procedure(s) were performed by non-physician practitioner and as supervising physician I was immediately available for consultation/collaboration.   Sunnie Nielsen, MD 09/09/13 [phone]

## 2013-09-10 NOTE — ED Provider Notes (Signed)
Medical screening examination/treatment/procedure(s) were performed by non-physician practitioner and as supervising physician I was immediately available for consultation/collaboration.    Vida Roller, MD 09/10/13 [phone]

## 2014-01-18 ENCOUNTER — Encounter (HOSPITAL_COMMUNITY): Payer: Self-pay | Admitting: Emergency Medicine

## 2014-01-18 ENCOUNTER — Emergency Department (HOSPITAL_COMMUNITY)
Admission: EM | Admit: 2014-01-18 | Discharge: 2014-01-18 | Disposition: A | Discharge status: Home / Self Care | Payer: Medicaid Other | Attending: Emergency Medicine | Admitting: Emergency Medicine

## 2014-01-18 DIAGNOSIS — IMO0002 Reserved for concepts with insufficient information to code with codable children: Secondary | ICD-10-CM | POA: Insufficient documentation

## 2014-01-18 DIAGNOSIS — F17200 Nicotine dependence, unspecified, uncomplicated: Secondary | ICD-10-CM | POA: Insufficient documentation

## 2014-01-18 DIAGNOSIS — N644 Mastodynia: Secondary | ICD-10-CM | POA: Insufficient documentation

## 2014-01-18 DIAGNOSIS — Z79899 Other long term (current) drug therapy: Secondary | ICD-10-CM | POA: Insufficient documentation

## 2014-01-18 DIAGNOSIS — Z8619 Personal history of other infectious and parasitic diseases: Secondary | ICD-10-CM | POA: Insufficient documentation

## 2014-01-18 NOTE — ED Notes (Signed)
The pt is c/o lt breast pain for 4 days.  No known injury  No nipple discharge no drainage no redness no previous ghistory

## 2014-01-18 NOTE — ED Provider Notes (Signed)
CSN: [id number]     Arrival date & time 01/18/14  1551 History  This chart was scribed for non-physician practitioner working with Hilario Quarryanielle S Ray, MD by Elveria Risingimelie Horne, ED Scribe. This patient was seen in room TR08C/TR08C and the patient's care was started at 5:01 PM.   Chief Complaint  Patient presents with  . Breast Pain      The history is provided by the patient. No language interpreter was used.   HPI Comments: Darnell Levelngelique Kunzler is a 23 y.o. female who presents to the Emergency Department complaining of sharp left breast pain that radiates to the nipple, onset three days ago. Patient says that it initially presented once or twice at night. However yesterday it occurred repeatedly throughout the day. Today she attempted to take a warm shower to ease the pain, but it remained. Patient reports that her last episode occurred 30 minutes before evaluation. Patient has not taken any pain medication. Patient denies fever, discharge from the nipple.  Denies CP and SOB. Patient denies causative trauma or injury. Patient denies family history of breast cancer and previous history of breast pain.   Past Medical History  Diagnosis Date  . Hip pain   . Toe pain   . Fungal infection of toenail   . Chest pain   . SOB (shortness of breath)   . Dizziness    History reviewed. No pertinent past surgical history. No family history on file. History  Substance Use Topics  . Smoking status: Current Every Day Smoker  . Smokeless tobacco: Not on file  . Alcohol Use: Yes     Comment: occasionally   OB History   Grav Para Term Preterm Abortions TAB SAB Ect Mult Living                 Review of Systems  Constitutional: Negative for fever and chills.  Respiratory: Negative for shortness of breath.   Cardiovascular: Negative for chest pain.  Musculoskeletal: Positive for myalgias.       Breast pain.   Skin: Negative for rash.      Allergies  Review of patient's allergies indicates no known  allergies.  Home Medications   Prior to Admission medications   Medication Sig Start Date End Date Taking? Authorizing Provider  diphenhydrAMINE (BENADRYL) 25 MG tablet Take 25 mg by mouth every 6 (six) hours as needed for itching.    Historical Provider, MD  hydrOXYzine (ATARAX/VISTARIL) 10 MG tablet Take 1 tablet (10 mg total) by mouth 3 (three) times daily as needed. 09/08/13   Tiffany Irine SealG Greene, PA-C  loratadine (CLARITIN) 10 MG tablet Take 1 tablet (10 mg total) by mouth daily. 09/08/13   Tiffany Irine SealG Greene, PA-C  predniSONE (DELTASONE) 10 MG tablet Take 2 tablets (20 mg total) by mouth daily. 09/08/13   Dorthula Matasiffany G Greene, PA-C   Triage Vitals: BP 115/80  Pulse 98  Temp(Src) 98 F (36.7 C) (Oral)  Resp 18  Wt 160 lb 4 oz (72.689 kg)  SpO2 99%  LMP 01/18/2014 Physical Exam  Nursing note and vitals reviewed. Constitutional: She is oriented to person, place, and time. She appears well-developed and well-nourished. No distress.  HENT:  Head: Normocephalic and atraumatic.  Eyes: EOM are normal.  Neck: Neck supple. No tracheal deviation present.  Cardiovascular: Normal rate.   Pulmonary/Chest: Effort normal. No respiratory distress.  Breast exam chaperoned by Albin Fellingarla, RN.    Remarkable for fibrocystic breasts, no nodules, no skin changes, no nipple discharge  Musculoskeletal: Normal  range of motion.  Neurological: She is alert and oriented to person, place, and time.  Skin: Skin is warm and dry.  Psychiatric: She has a normal mood and affect. Her behavior is normal.    ED Course  Procedures (including critical care time) DIAGNOSTIC STUDIES: Oxygen Saturation is 99% on room air, normal by my interpretation.    COORDINATION OF CARE: 5:05 PM- Will refer to [facility]. Discussed treatment plan with patient at bedside and patient agreed to plan.     Labs Review Labs Reviewed - No data to display  Imaging Review No results found.   EKG Interpretation None      MDM    Final diagnoses:  Breast pain    Patient with breast pain, will discharge to home with [facility] follow-up next week.  Patient understands and agrees with the plan.  She is stable and ready for discharge.  Recommend tylenol and ibuprofen for pain.  I personally performed the services described in this documentation, which was scribed in my presence. The recorded information has been reviewed and is accurate.     Roxy Horsemanobert Tonji Elliff, PA-C 01/18/14 1714

## 2014-01-18 NOTE — Discharge Instructions (Signed)
Breast Tenderness Breast tenderness is a common problem for women of all ages. Breast tenderness may cause mild discomfort to severe pain. It has a variety of causes. Your health care provider will find out the likely cause of your breast tenderness by examining your breasts, asking you about symptoms, and ordering some tests. Breast tenderness usually does not mean you have breast cancer. HOME CARE INSTRUCTIONS  Breast tenderness often can be handled at home. You can try:  Getting fitted for a new bra that provides more support, especially during exercise.  Wearing a more supportive bra or sports bra while sleeping when your breasts are very tender.  If you have a breast injury, apply ice to the area:  Put ice in a plastic bag.  Place a towel between your skin and the bag.  Leave the ice on for 20 minutes, 2 3 times a day.  If your breasts are too full of milk as a result of breastfeeding, try:  Expressing milk either by hand or with a breast pump.  Applying a warm compress to the breasts for relief.  Taking over-the-counter pain relievers, if approved by your health care provider.  Taking other medicines that your health care provider prescribes. These may include antibiotic medicines or birth control pills. Over the long term, your breast tenderness might be eased if you:  Cut down on caffeine.  Reduce the amount of fat in your diet. Keep a log of the days and times when your breasts are most tender. This will help you and your health care provider find the cause of the tenderness and how to relieve it. Also, learn how to do breast exams at home. This will help you notice if you have an unusual growth or lump that could cause tenderness. SEEK MEDICAL CARE IF:   Any part of your breast is hard, red, and hot to the touch. This could be a sign of infection.  Fluid is coming out of your nipples (and you are not breastfeeding). Especially watch for blood or pus.  You have a fever  as well as breast tenderness.  You have a new or painful lump in your breast that remains after your menstrual period ends.  You have tried to take care of the pain at home, but it has not gone away.  Your breast pain is getting worse, or the pain is making it hard to do the things you usually do during your day. Document Released: 09/01/2008 Document Revised: 05/22/2013 Document Reviewed: 04/18/2013 [employer] Patient Information 2014 [employer]  

## 2014-01-18 NOTE — ED Notes (Signed)
Onset 01-15-14 left breast pain, stabbing, burning, pins/needles that is getting more frequent.  No nipple discharge or redness.  No injuries to breast.  No other s/s noted.

## 2014-01-19 NOTE — ED Provider Notes (Signed)
History/physical exam/procedure(s) were performed by non-physician practitioner and as supervising physician I was immediately available for consultation/collaboration. I have reviewed all notes and am in agreement with care and plan.   Filippa Yarbough S Veronika Heard, MD 01/19/14 1856 

## 2014-01-20 ENCOUNTER — Other Ambulatory Visit (INDEPENDENT_AMBULATORY_CARE_PROVIDER_SITE_OTHER): Payer: Self-pay | Admitting: General Surgery

## 2014-01-20 ENCOUNTER — Other Ambulatory Visit: Payer: Self-pay | Admitting: Emergency Medicine

## 2014-01-20 DIAGNOSIS — N644 Mastodynia: Secondary | ICD-10-CM

## 2014-01-21 ENCOUNTER — Inpatient Hospital Stay: Admission: RE | Admit: 2014-01-21 | Payer: Medicaid Other | Source: Ambulatory Visit

## 2016-02-18 ENCOUNTER — Emergency Department (HOSPITAL_COMMUNITY): Payer: Medicaid Other

## 2016-02-18 ENCOUNTER — Encounter (HOSPITAL_COMMUNITY): Payer: Self-pay

## 2016-02-18 ENCOUNTER — Emergency Department (HOSPITAL_COMMUNITY)
Admission: EM | Admit: 2016-02-18 | Discharge: 2016-02-18 | Disposition: A | Discharge status: Home / Self Care | Payer: Medicaid Other | Attending: Emergency Medicine | Admitting: Emergency Medicine

## 2016-02-18 DIAGNOSIS — Z3202 Encounter for pregnancy test, result negative: Secondary | ICD-10-CM | POA: Insufficient documentation

## 2016-02-18 DIAGNOSIS — R0789 Other chest pain: Secondary | ICD-10-CM | POA: Insufficient documentation

## 2016-02-18 DIAGNOSIS — F17200 Nicotine dependence, unspecified, uncomplicated: Secondary | ICD-10-CM | POA: Insufficient documentation

## 2016-02-18 DIAGNOSIS — Z8619 Personal history of other infectious and parasitic diseases: Secondary | ICD-10-CM | POA: Insufficient documentation

## 2016-02-18 LAB — BASIC METABOLIC PANEL
ANION GAP: 12 (ref 5–15)
BUN: 11 mg/dL (ref 6–20)
CO2: 24 mmol/L (ref 22–32)
Calcium: 9.2 mg/dL (ref 8.9–10.3)
Chloride: 103 mmol/L (ref 101–111)
Creatinine, Ser: 0.84 mg/dL (ref 0.44–1.00)
Glucose, Bld: 78 mg/dL (ref 65–99)
POTASSIUM: 4.1 mmol/L (ref 3.5–5.1)
SODIUM: 139 mmol/L (ref 135–145)

## 2016-02-18 LAB — CBC
HEMATOCRIT: 38.8 % (ref 36.0–46.0)
HEMOGLOBIN: 12.2 g/dL (ref 12.0–15.0)
MCH: 26 pg (ref 26.0–34.0)
MCHC: 31.4 g/dL (ref 30.0–36.0)
MCV: 82.7 fL (ref 78.0–100.0)
Platelets: 378 10*3/uL (ref 150–400)
RBC: 4.69 MIL/uL (ref 3.87–5.11)
RDW: 13.2 % (ref 11.5–15.5)
WBC: 6.3 10*3/uL (ref 4.0–10.5)

## 2016-02-18 LAB — I-STAT BETA HCG BLOOD, ED (MC, WL, AP ONLY)

## 2016-02-18 LAB — I-STAT TROPONIN, ED: Troponin i, poc: 0.02 ng/mL (ref 0.00–0.08)

## 2016-02-18 MED ORDER — IBUPROFEN 800 MG PO TABS: 800.0000 mg | ORAL_TABLET | Freq: Three times a day (TID) | ORAL | Status: DC

## 2016-02-18 MED ORDER — KETOROLAC TROMETHAMINE 60 MG/2ML IM SOLN
60.0000 mg | Freq: Once | INTRAMUSCULAR | Status: AC
  Administered 2016-02-18: 60 mg via INTRAMUSCULAR
  Filled 2016-02-18: qty 2

## 2016-02-18 NOTE — ED Notes (Addendum)
Patient complains of 4 years of intermittent chest pain that she describes as squeezing, sharp pain. Denies radiation, denies associated symptoms. States she has seen MD for same and no diagnosis made. Denies injury but reports doing some lifting at work.

## 2016-02-18 NOTE — Discharge Instructions (Signed)
Your chest xray, EKG, and lab work today are normal.  I do not think your pain is cardiac related.  I don't think it is a clot in your lungs.  It is likely musculoskeletal pain.  This is treated with anti-inflammatories like Ibuprofen.  Take 800 mg Ibuprofen three times daily.  Follow up with [facility].  Return to the ED if you experience worsening pain, shortness of breath, or find it difficulty to breath when exercising.   Nonspecific Chest Pain  Chest pain can be caused by many different conditions. There is always a chance that your pain could be related to something serious, such as a heart attack or a blood clot in your lungs. Chest pain can also be caused by conditions that are not life-threatening. If you have chest pain, it is very important to follow up with your health care provider. CAUSES  Chest pain can be caused by:  Heartburn.  Pneumonia or bronchitis.  Anxiety or stress.  Inflammation around your heart (pericarditis) or lung (pleuritis or pleurisy).  A blood clot in your lung.  A collapsed lung (pneumothorax). It can develop suddenly on its own (spontaneous pneumothorax) or from trauma to the chest.  Shingles infection (varicella-zoster virus).  Heart attack.  Damage to the bones, muscles, and cartilage that make up your chest wall. This can include:  Bruised bones due to injury.  Strained muscles or cartilage due to frequent or repeated coughing or overwork.  Fracture to one or more ribs.  Sore cartilage due to inflammation (costochondritis). RISK FACTORS  Risk factors for chest pain may include:  Activities that increase your risk for trauma or injury to your chest.  Respiratory infections or conditions that cause frequent coughing.  Medical conditions or overeating that can cause heartburn.  Heart disease or family history of heart disease.  Conditions or health behaviors that increase your risk of developing a blood  clot.  Having had chicken pox (varicella zoster). SIGNS AND SYMPTOMS Chest pain can feel like:  Burning or tingling on the surface of your chest or deep in your chest.  Crushing, pressure, aching, or squeezing pain.  Dull or sharp pain that is worse when you move, cough, or take a deep breath.  Pain that is also felt in your back, neck, shoulder, or arm, or pain that spreads to any of these areas. Your chest pain may come and go, or it may stay constant. DIAGNOSIS Lab tests or other studies may be needed to find the cause of your pain. Your health care provider may have you take a test called an ambulatory ECG (electrocardiogram). An ECG records your heartbeat patterns at the time the test is performed. You may also have other tests, such as:  Transthoracic echocardiogram (TTE). During echocardiography, sound waves are used to create a picture of all of the heart structures and to look at how blood flows through your heart.  Transesophageal echocardiogram (TEE).This is a more advanced imaging test that obtains images from inside your body. It allows your health care provider to see your heart in finer detail.  Cardiac monitoring. This allows your health care provider to monitor your heart rate and rhythm in real time.  Holter monitor. This is a portable device that records your heartbeat and can help to diagnose abnormal heartbeats. It allows your health care provider to track your heart activity for several days, if needed.  Stress tests. These can be done through exercise or by taking medicine  that makes your heart beat more quickly.  Blood tests.  Imaging tests. TREATMENT  Your treatment depends on what is causing your chest pain. Treatment may include:  Medicines. These may include:  Acid blockers for heartburn.  Anti-inflammatory medicine.  Pain medicine for inflammatory conditions.  Antibiotic medicine, if an infection is present.  Medicines to dissolve blood  clots.  Medicines to treat coronary artery disease.  Supportive care for conditions that do not require medicines. This may include:  Resting.  Applying heat or cold packs to injured areas.  Limiting activities until pain decreases. HOME CARE INSTRUCTIONS  If you were prescribed an antibiotic medicine, finish it all even if you start to feel better.  Avoid any activities that bring on chest pain.  Do not use any tobacco products, including cigarettes, chewing tobacco, or electronic cigarettes. If you need help quitting, ask your health care provider.  Do not drink alcohol.  Take medicines only as directed by your health care provider.  Keep all follow-up visits as directed by your health care provider. This is important. This includes any further testing if your chest pain does not go away.  If heartburn is the cause for your chest pain, you may be told to keep your head raised (elevated) while sleeping. This reduces the chance that acid will go from your stomach into your esophagus.  Make lifestyle changes as directed by your health care provider. These may include:  Getting regular exercise. Ask your health care provider to suggest some activities that are safe for you.  Eating a heart-healthy diet. A registered dietitian can help you to learn healthy eating options.  Maintaining a healthy weight.  Managing diabetes, if necessary.  Reducing stress. SEEK MEDICAL CARE IF:  Your chest pain does not go away after treatment.  You have a rash with blisters on your chest.  You have a fever. SEEK IMMEDIATE MEDICAL CARE IF:   Your chest pain is worse.  You have an increasing cough, or you cough up blood.  You have severe abdominal pain.  You have severe weakness.  You faint.  You have chills.  You have sudden, unexplained chest discomfort.  You have sudden, unexplained discomfort in your arms, back, neck, or jaw.  You have shortness of breath at any  time.  You suddenly start to sweat, or your skin gets clammy.  You feel nauseous or you vomit.  You suddenly feel light-headed or dizzy.  Your heart begins to beat quickly, or it feels like it is skipping beats. These symptoms may represent a serious problem that is an emergency. Do not wait to see if the symptoms will go away. Get medical help right away. Call your local emergency services (911 in the [place] Do not drive yourself to the hospital.   This information is not intended to replace advice given to you by your health care provider. Make sure you discuss any questions you have with your health care provider.   Document Released: 06/29/2005 Document Revised: 10/10/2014 Document Reviewed: 04/25/2014 Elsevier Interactive Patient Education [employer]

## 2016-02-18 NOTE — ED Notes (Signed)
PA at bedside.

## 2016-02-18 NOTE — ED Provider Notes (Signed)
CSN: [id number]     Arrival date & time 02/18/16  7829 History   First MD Initiated Contact with Patient 02/18/16 0920     Chief Complaint  Patient presents with  . Chest Pain     (Consider location/radiation/quality/duration/timing/severity/associated sxs/prior Treatment) HPI   Kylie Anderson is a 25 y.o. female with no significant PMH who presents with 12 year history, but worsening the last 2 days, left sided/central, non-radiating, chest pain she describes as stabbing and squeezing that lasts for 15 seconds and occurs about 3-4 times a day.  She states that it seems worse with deep inspiration and with movement.  No modifying factors.  No prior treatment.  Not exertional.  She reports it came on this morning about 6 AM when she was writing on boxes at work.  She states at the time "the pain was just so bad, I couldn't take it, and I got lightheaded".  Denies personal cardiac history.  No family hx of SCD.  Denies drugs, alcohol, tobacco use.  No hx of DVT/PE.  No exogenous estrogen use.  No unilateral lower extremity edema.  No recent immobilization.  No recent surgery/trauma.  Denies fever, chills, cough, SOB, N/V/D, abdominal pain, or urinary symptoms.   Past Medical History  Diagnosis Date  . Hip pain   . Toe pain   . Fungal infection of toenail   . Chest pain   . SOB (shortness of breath)   . Dizziness    History reviewed. No pertinent past surgical history. No family history on file. Social History  Substance Use Topics  . Smoking status: Current Every Day Smoker  . Smokeless tobacco: None  . Alcohol Use: Yes     Comment: occasionally   OB History    No data available     Review of Systems All other systems negative unless otherwise stated in HPI     Allergies  Review of patient's allergies indicates no known allergies.  Home Medications   Prior to Admission medications   Medication Sig Start Date End Date Taking? Authorizing Provider  ibuprofen  (ADVIL,MOTRIN) 800 MG tablet Take 1 tablet (800 mg total) by mouth 3 (three) times daily. 02/18/16   Karson Reede, PA-C   BP 119/80 mmHg  Pulse 88  Temp(Src) 98.8 F (37.1 C) (Oral)  Resp 19  Ht  (1.626 m)  Wt 79.379 kg  BMI 30.02 kg/m2  SpO2 96%  LMP 02/12/2016 Physical Exam  Constitutional: She is oriented to person, place, and time. She appears well-developed and well-nourished.  Non-toxic appearance. She does not have a sickly appearance. She does not appear ill.  HENT:  Head: Normocephalic and atraumatic.  Mouth/Throat: Oropharynx is clear and moist.  Eyes: Conjunctivae are normal.  Neck: Normal range of motion. Neck supple.  Cardiovascular: Normal rate and regular rhythm.   Pulmonary/Chest: Effort normal and breath sounds normal. No accessory muscle usage or stridor. No respiratory distress. She has no wheezes. She has no rhonchi. She has no rales. She exhibits tenderness.    Abdominal: Soft. Bowel sounds are normal. She exhibits no distension. There is no tenderness.  Musculoskeletal: Normal range of motion.  Lymphadenopathy:    She has no cervical adenopathy.  Neurological: She is alert and oriented to person, place, and time.  Speech clear without dysarthria.  Skin: Skin is warm and dry.  Psychiatric: She has a normal mood and affect. Her behavior is normal.    ED Course  Procedures (including critical care time) Labs  Review Labs Reviewed  BASIC METABOLIC PANEL  CBC  I-STAT TROPOININ, ED  I-STAT BETA HCG BLOOD, ED ([facility], [facility], [facility] ONLY)    Imaging Review Dg Chest 2 View  02/18/2016  CLINICAL DATA:  Two day history of left-sided chest pain EXAM: CHEST  2 VIEW COMPARISON:  January 12, 2013 FINDINGS: The lungs are clear. The heart size and pulmonary vascularity are normal. No adenopathy. No pneumothorax. No bone lesions. IMPRESSION: No edema or consolidation. Electronically Signed   By: Bretta BangWilliam  Woodruff III M.D.   On: 02/18/2016 09:45   I have personally reviewed and  evaluated these images and lab results as part of my medical decision-making.   EKG Interpretation   Date/Time:  Thursday Feb 18 2016 09:12:10 EDT Ventricular Rate:  79 PR Interval:  120 QRS Duration: 84 QT Interval:  364 QTC Calculation: 417 R Axis:   68 Text Interpretation:  Normal sinus rhythm Normal ECG No significant change  since last tracing Confirmed by FLOYD MD, DANIEL ([id number]) on 02/18/2016  9:21:25 AM      MDM   Final diagnoses:  Chest wall pain   Patient presents with chest pain.  Not exertional.  VSS, NAD.  On exam, patient appears well and in NAD.  Heart RRR, lungs CTAB, abdomen soft and benign.  Chest pain slightly reproducible upon palpation of left anterior chest wall.  DDx includes: PE, ACS, AD, PNA, musculoskeletal pain/chest wall pain.  She is PERC negative.  HEART score 1.  Plan to obtain labs, EKG, and CXR.  Troponin 0.02.  EKG without acute changes.  CXR negative. Labs without acute abnormalities.  Low suspicion for ACS, PE, or AD.  Given patient age and no cardiac risk factors, I do not think this is cardiac in nature.  No indication for delta troponin.  This is likely chest wall pain.  Patient given Toradol with resolution.     Cheri FowlerKayla Devan Danzer, PA-C 02/18/16 1116  Melene Planan Floyd, DO 02/18/16 1123

## 2016-12-28 ENCOUNTER — Encounter (HOSPITAL_COMMUNITY): Payer: Self-pay | Admitting: Emergency Medicine

## 2016-12-28 DIAGNOSIS — Z5321 Procedure and treatment not carried out due to patient leaving prior to being seen by health care provider: Secondary | ICD-10-CM | POA: Insufficient documentation

## 2016-12-28 DIAGNOSIS — H61891 Other specified disorders of right external ear: Secondary | ICD-10-CM | POA: Insufficient documentation

## 2016-12-28 NOTE — ED Triage Notes (Signed)
Patient mainly concerned with lump behind right ear. Wants to knmow if it will go away by itself. No drainage, pain = 2/10

## 2016-12-29 ENCOUNTER — Emergency Department (HOSPITAL_COMMUNITY)
Admission: EM | Admit: 2016-12-29 | Discharge: 2016-12-29 | Disposition: A | Discharge status: Home / Self Care | Payer: Medicaid Other | Attending: Dermatology | Admitting: Dermatology

## 2017-09-21 ENCOUNTER — Encounter (HOSPITAL_COMMUNITY): Payer: Self-pay

## 2017-09-21 ENCOUNTER — Emergency Department (HOSPITAL_COMMUNITY)
Admission: EM | Admit: 2017-09-21 | Discharge: 2017-09-21 | Disposition: A | Discharge status: Home / Self Care | Payer: Self-pay | Attending: Emergency Medicine | Admitting: Emergency Medicine

## 2017-09-21 ENCOUNTER — Other Ambulatory Visit: Payer: Self-pay

## 2017-09-21 ENCOUNTER — Emergency Department (HOSPITAL_COMMUNITY): Payer: Self-pay

## 2017-09-21 DIAGNOSIS — M79672 Pain in left foot: Secondary | ICD-10-CM | POA: Insufficient documentation

## 2017-09-21 DIAGNOSIS — F17210 Nicotine dependence, cigarettes, uncomplicated: Secondary | ICD-10-CM | POA: Insufficient documentation

## 2017-09-21 MED ORDER — IBUPROFEN 800 MG PO TABS
800.0000 mg | ORAL_TABLET | Freq: Once | ORAL | Status: AC
  Administered 2017-09-21: 800 mg via ORAL
  Filled 2017-09-21: qty 1

## 2017-09-21 MED ORDER — INDOMETHACIN 50 MG PO CAPS: 50.0000 mg | ORAL_CAPSULE | Freq: Two times a day (BID) | ORAL | 0 refills | Status: AC

## 2017-09-21 NOTE — ED Notes (Signed)
Pt stated that her pain is increasing.

## 2017-09-21 NOTE — ED Triage Notes (Signed)
Pt reports 10/10 left foot pain x4 days. Pt denies injury. Pt denies DM. Pt ambulatory.

## 2017-09-21 NOTE — ED Notes (Signed)
Post op shoe applied. Patient verbalized understanding of home use and RX medication use.

## 2017-09-21 NOTE — ED Provider Notes (Signed)
[facility]-EMERGENCY DEPT Provider Note   CSN: [id number] Arrival date & time: 09/21/17  0006     History   Chief Complaint Chief Complaint  Patient presents with  . Foot Pain    Left    HPI Kylie Anderson is a 26 y.o. female.  Patient presents to the ED with a chief complaint of left foot pain.  She states that she awoke with the pain about 4 days ago. She denies any injury or trauma.  She denies any fevers, chills, numbness, weakness, or tingling.  She denies having taken anything for her symptoms.  The symptoms are worsened with palpation and movement.   The history is provided by the patient. No language interpreter was used.    Past Medical History:  Diagnosis Date  . Chest pain   . Dizziness   . Fungal infection of toenail   . Hip pain   . SOB (shortness of breath)   . Toe pain     There are no active problems to display for this patient.   History reviewed. No pertinent surgical history.  OB History    No data available       Home Medications    Prior to Admission medications   Medication Sig Start Date End Date Taking? Authorizing Provider  ibuprofen (ADVIL,MOTRIN) 800 MG tablet Take 1 tablet (800 mg total) by mouth 3 (three) times daily. 02/18/16   Cheri Fowlerose, Kayla, PA-C    Family History History reviewed. No pertinent family history.  Social History Social History   Tobacco Use  . Smoking status: Current Some Day Smoker  . Smokeless tobacco: Never Used  Substance Use Topics  . Alcohol use: Yes    Comment: occasionally  . Drug use: Yes    Frequency: 1.0 times per week    Types: Marijuana     Allergies   Patient has no known allergies.   Review of Systems Review of Systems  All other systems reviewed and are negative.    Physical Exam Updated Vital Signs BP 123/74 (BP Location: Left Arm)   Pulse 76   Temp 98.1 F (36.7 C) (Oral)   Resp 16   SpO2 100%   Physical Exam  Constitutional: She is  oriented to person, place, and time. No distress.  HENT:  Head: Normocephalic and atraumatic.  Eyes: Conjunctivae and EOM are normal. Pupils are equal, round, and reactive to light.  Neck: No tracheal deviation present.  Cardiovascular: Normal rate and intact distal pulses.  Intact dp and pt pulses, brisk cap refill  Pulmonary/Chest: Effort normal. No respiratory distress.  Abdominal: Soft.  Musculoskeletal: Normal range of motion.  Left great toe unremarkable for any bony abnormality or deformity, ROM and strength is limited 2/2 pain  Neurological: She is alert and oriented to person, place, and time.  Skin: Skin is warm and dry. She is not diaphoretic.  No evidence of cellulitis or infection  Psychiatric: Judgment normal.  Nursing note and vitals reviewed.    ED Treatments / Results  Labs (all labs ordered are listed, but only abnormal results are displayed) Labs Reviewed - No data to display  EKG  EKG Interpretation None       Radiology No results found.  Procedures Procedures (including critical care time)  Medications Ordered in ED Medications - No data to display   Initial Impression / Assessment and Plan / ED Course  I have reviewed the triage vital signs and the nursing notes.  Pertinent labs &  imaging results that were available during my care of the patient were reviewed by me and considered in my medical decision making (see chart for details).     Patient with left foot pain.  No traumatic injuries.  Plain films are negative.  Vital signs are stable.  No evidence of infection.  Question gout.  Will treat with indomethacin.  Recommend PCP follow-up.  Final Clinical Impressions(s) / ED Diagnoses   Final diagnoses:  Left foot pain    ED Discharge Orders        Ordered    indomethacin (INDOCIN) 50 MG capsule  2 times daily with meals     09/21/17 0619       Roxy HorsemanBrowning, Aaric Dolph, PA-C 09/21/17 [id number]    Paula LibraMolpus, John, MD 09/21/17 [phone]

## 2018-08-08 ENCOUNTER — Emergency Department (HOSPITAL_COMMUNITY): Payer: No Typology Code available for payment source

## 2018-08-08 ENCOUNTER — Emergency Department (HOSPITAL_COMMUNITY)
Admission: EM | Admit: 2018-08-08 | Discharge: 2018-08-08 | Disposition: A | Discharge status: Home / Self Care | Payer: No Typology Code available for payment source | Attending: Emergency Medicine | Admitting: Emergency Medicine

## 2018-08-08 ENCOUNTER — Encounter (HOSPITAL_COMMUNITY): Payer: Self-pay | Admitting: *Deleted

## 2018-08-08 DIAGNOSIS — S3992XA Unspecified injury of lower back, initial encounter: Secondary | ICD-10-CM | POA: Diagnosis not present

## 2018-08-08 DIAGNOSIS — Y998 Other external cause status: Secondary | ICD-10-CM | POA: Insufficient documentation

## 2018-08-08 DIAGNOSIS — Y93I9 Activity, other involving external motion: Secondary | ICD-10-CM | POA: Diagnosis not present

## 2018-08-08 DIAGNOSIS — Y92410 Unspecified street and highway as the place of occurrence of the external cause: Secondary | ICD-10-CM | POA: Diagnosis not present

## 2018-08-08 DIAGNOSIS — F17210 Nicotine dependence, cigarettes, uncomplicated: Secondary | ICD-10-CM | POA: Diagnosis not present

## 2018-08-08 DIAGNOSIS — S199XXA Unspecified injury of neck, initial encounter: Secondary | ICD-10-CM | POA: Insufficient documentation

## 2018-08-08 MED ORDER — ACETAMINOPHEN 500 MG PO TABS: 500.0000 mg | ORAL_TABLET | Freq: Four times a day (QID) | ORAL | 0 refills | Status: AC | PRN

## 2018-08-08 MED ORDER — METHOCARBAMOL 500 MG PO TABS: 500.0000 mg | ORAL_TABLET | Freq: Two times a day (BID) | ORAL | 0 refills | Status: AC

## 2018-08-08 MED ORDER — IBUPROFEN 800 MG PO TABS: 800.0000 mg | ORAL_TABLET | Freq: Four times a day (QID) | ORAL | 0 refills | Status: AC | PRN
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
## 2018-08-08 NOTE — Discharge Instructions (Signed)
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
## 2019-07-18 ENCOUNTER — Inpatient Hospital Stay (HOSPITAL_COMMUNITY)
Admission: AD | Admit: 2019-07-18 | Discharge: 2019-07-18 | Discharge status: Absent without leave | Payer: No Typology Code available for payment source | Attending: Obstetrics and Gynecology | Admitting: Obstetrics and Gynecology

## 2019-07-18 ENCOUNTER — Other Ambulatory Visit: Payer: Self-pay

## 2020-01-26 ENCOUNTER — Other Ambulatory Visit: Payer: Self-pay

## 2020-01-26 ENCOUNTER — Emergency Department (HOSPITAL_COMMUNITY)
Admission: EM | Admit: 2020-01-26 | Discharge: 2020-01-26 | Disposition: A | Discharge status: Home / Self Care | Payer: HRSA Program | Attending: Emergency Medicine | Admitting: Emergency Medicine

## 2020-01-26 ENCOUNTER — Encounter (HOSPITAL_COMMUNITY): Payer: Self-pay | Admitting: Emergency Medicine

## 2020-01-26 DIAGNOSIS — U071 COVID-19: Secondary | ICD-10-CM | POA: Insufficient documentation

## 2020-01-26 DIAGNOSIS — F17200 Nicotine dependence, unspecified, uncomplicated: Secondary | ICD-10-CM | POA: Insufficient documentation

## 2020-01-26 DIAGNOSIS — R0602 Shortness of breath: Secondary | ICD-10-CM | POA: Diagnosis present

## 2020-01-26 DIAGNOSIS — F1210 Cannabis abuse, uncomplicated: Secondary | ICD-10-CM | POA: Insufficient documentation

## 2020-01-26 MED ORDER — ALBUTEROL SULFATE HFA 108 (90 BASE) MCG/ACT IN AERS
2.0000 | INHALATION_SPRAY | Freq: Once | RESPIRATORY_TRACT | Status: AC
  Administered 2020-01-26: 2 via RESPIRATORY_TRACT
  Filled 2020-01-26: qty 6.7

## 2020-01-26 NOTE — ED Provider Notes (Signed)
[facility]-EMERGENCY DEPT Provider Note   CSN: [id number] Arrival date & time: 01/26/20  1528     History Chief Complaint  Patient presents with  . Covid positive  . Shortness of Breath    Kylie Anderson is a 29 y.o. female.  HPI She presents for evaluation of shortness of breath, episodic, over the last couple of days.  She was diagnosed with COVID-19 infection, 1 week ago after being ill for 4 days.  She denies fever, chills, cough, chest pain, weakness or dizziness.  She has a remote history of asthma as a child.  She has been quarantining herself.  She works from home and plans on resuming work when she can talk better.  There are no other known modifying factors.    Past Medical History:  Diagnosis Date  . Chest pain   . Dizziness   . Fungal infection of toenail   . Hip pain   . SOB (shortness of breath)   . Toe pain     There are no problems to display for this patient.   History reviewed. No pertinent surgical history.   OB History   No obstetric history on file.     No family history on file.  Social History   Tobacco Use  . Smoking status: Current Some Day Smoker  . Smokeless tobacco: Never Used  Substance Use Topics  . Alcohol use: Yes    Comment: occasionally  . Drug use: Yes    Frequency: 1.0 times per week    Types: Marijuana    Home Medications Prior to Admission medications   Medication Sig Start Date End Date Taking? Authorizing Provider  Chlorphen-Pseudoephed-APAP [facility] FLU/COLD PO) Take 2 capsules by mouth daily as needed (cold symptoms).   Yes [provider]  acetaminophen (TYLENOL) 500 MG tablet Take 1 tablet (500 mg total) by mouth every 6 (six) hours as needed. Patient not taking: Reported on 01/26/2020 08/08/18   Emi Holes, PA-C  ibuprofen (ADVIL,MOTRIN) 800 MG tablet Take 1 tablet (800 mg total) by mouth every 6 (six) hours as needed. Patient not taking: Reported on 01/26/2020 08/08/18    Emi Holes, PA-C  indomethacin (INDOCIN) 50 MG capsule Take 1 capsule (50 mg total) by mouth 2 (two) times daily with a meal. Patient not taking: Reported on 08/08/2018 09/21/17   Roxy Horseman, PA-C  methocarbamol (ROBAXIN) 500 MG tablet Take 1 tablet (500 mg total) by mouth 2 (two) times daily. Patient not taking: Reported on 01/26/2020 08/08/18   Emi Holes, PA-C    Allergies    Patient has no known allergies.  Review of Systems   Review of Systems  All other systems reviewed and are negative.   Physical Exam Updated Vital Signs BP 123/78 (BP Location: Left Arm)   Pulse 78   Temp 98.7 F (37.1 C)   Resp 19   SpO2 100%   Physical Exam Vitals and nursing note reviewed.  Constitutional:      General: She is not in acute distress.    Appearance: She is well-developed. She is not ill-appearing, toxic-appearing or diaphoretic.  HENT:     Head: Normocephalic and atraumatic.  Eyes:     Conjunctiva/sclera: Conjunctivae normal.     Pupils: Pupils are equal, round, and reactive to light.  Neck:     Trachea: Phonation normal.  Cardiovascular:     Rate and Rhythm: Normal rate.  Pulmonary:     Effort: Pulmonary effort is normal.  No respiratory distress.     Breath sounds: No stridor.  Chest:     Chest wall: No tenderness.  Abdominal:     General: There is no distension.     Palpations: Abdomen is soft.  Musculoskeletal:        General: Normal range of motion.     Cervical back: Normal range of motion and neck supple.  Skin:    General: Skin is warm and dry.  Neurological:     Mental Status: She is alert and oriented to person, place, and time.     Motor: No abnormal muscle tone.  Psychiatric:        Mood and Affect: Mood normal.        Behavior: Behavior normal.        Thought Content: Thought content normal.        Judgment: Judgment normal.     ED Results / Procedures / Treatments   Labs (all labs ordered are listed, but only abnormal results are  displayed) Labs Reviewed - No data to display  EKG None  Radiology No results found.  Procedures Procedures (including critical care time)  Medications Ordered in ED Medications  albuterol (VENTOLIN HFA) 108 (90 Base) MCG/ACT inhaler 2 puff (has no administration in time range)    ED Course  I have reviewed the triage vital signs and the nursing notes.  Pertinent labs & imaging results that were available during my care of the patient were reviewed by me and considered in my medical decision making (see chart for details).    MDM Rules/Calculators/A&P                       Patient Vitals for the past 24 hrs:  BP Temp Pulse Resp SpO2  01/26/20 1827 123/78 -- 78 19 100 %  01/26/20 1541 (!) 116/93 98.7 F (37.1 C) 96 18 100 %    7:11 PM Reevaluation with update and discussion. After initial assessment and treatment, an updated evaluation reveals she remains comfortable has no further complaints.  She requests an albuterol inhaler to use at home because she is worried that she will have another episode of shortness of breath.  Findings discussed and questions answered. Daleen Bo   Medical Decision Making:  This patient is presenting for evaluation of extent of Covid illness, which does require a range of treatment options, and is a complaint that involves a moderate to high risk of morbidity and mortality. The differential diagnoses include COVID-19 infection, pneumonia, metabolic instability, hemodynamic collapse. I decided  to review old records, and in summary healthy young female without ongoing lung symptoms. I did not require additional historical information from anyone.  Kylie Anderson was evaluated in Emergency Department on 01/26/2020 for the symptoms described in the history of present illness. She was evaluated in the context of the global COVID-19 pandemic, which necessitated consideration that the patient might be at risk for infection with the SARS-CoV-2  virus that causes COVID-19. Institutional protocols and algorithms that pertain to the evaluation of patients at risk for COVID-19 are in a state of rapid change based on information released by regulatory bodies including the CDC and federal and state organizations. These policies and algorithms were followed during the patient's care in the ED.  Critical Interventions-clinical evaluation and observation.  After These Interventions, the Patient was reevaluated and was found stable for discharge.  No evidence for respiratory distress or suggestion for hemodynamic instability.  CRITICAL CARE-no  Performed by: Mancel Bale  Nursing Notes Reviewed/ Care Coordinated Applicable Imaging Reviewed Interpretation of Laboratory Data incorporated into ED treatment  The patient appears reasonably screened and/or stabilized for discharge and I doubt any other medical condition or other [facility] requiring further screening, evaluation, or treatment in the ED at this time prior to discharge.  Plan: Home Medications-albuterol inhaler, to take home, and use as needed every 3 to 4 hours, symptomatic treatment with OTC medications; Home Treatments-gradually advance, diet and activity; return here if the recommended treatment, does not improve the symptoms; Recommended follow up-PCP of choice as needed   Final Clinical Impression(s) / ED Diagnoses Final diagnoses:  COVID-19    Rx / DC Orders ED Discharge Orders    None       Mancel Bale, MD 01/26/20 [phone]

## 2020-01-26 NOTE — Discharge Instructions (Addendum)
Continue to rest and drink a lot of fluids.  Use the albuterol inhaler 2 puffs every 3-4 hours as needed for trouble breathing.  See your doctor, as needed for problems.

## 2020-01-26 NOTE — ED Triage Notes (Signed)
Per [facility] pt from home for SOB pt is Covid +.  Vitals: 136/90, 99% on RA,

## 2020-01-26 NOTE — ED Notes (Signed)
Pt verbalized d/c instructions and follow up care. Alert and ambulatory. No iv. Demonstrated proper use of inhaler
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
## 2021-08-10 ENCOUNTER — Emergency Department (HOSPITAL_BASED_OUTPATIENT_CLINIC_OR_DEPARTMENT_OTHER)
Admission: EM | Admit: 2021-08-10 | Discharge: 2021-08-10 | Disposition: A | Discharge status: Home / Self Care | Payer: Self-pay | Attending: Emergency Medicine | Admitting: Emergency Medicine

## 2021-08-10 ENCOUNTER — Encounter (HOSPITAL_BASED_OUTPATIENT_CLINIC_OR_DEPARTMENT_OTHER): Payer: Self-pay | Admitting: Emergency Medicine

## 2021-08-10 ENCOUNTER — Other Ambulatory Visit: Payer: Self-pay

## 2021-08-10 DIAGNOSIS — K0530 Chronic periodontitis, unspecified: Secondary | ICD-10-CM | POA: Insufficient documentation

## 2021-08-10 DIAGNOSIS — F17210 Nicotine dependence, cigarettes, uncomplicated: Secondary | ICD-10-CM | POA: Insufficient documentation

## 2021-08-10 MED ORDER — KETOROLAC TROMETHAMINE 60 MG/2ML IM SOLN
30.0000 mg | Freq: Once | INTRAMUSCULAR | Status: AC
  Administered 2021-08-10: 30 mg via INTRAMUSCULAR
  Filled 2021-08-10: qty 2

## 2021-08-10 MED ORDER — AMOXICILLIN 500 MG PO CAPS
500.0000 mg | ORAL_CAPSULE | Freq: Once | ORAL | Status: AC
  Administered 2021-08-10: 500 mg via ORAL
  Filled 2021-08-10: qty 1

## 2021-08-10 MED ORDER — AMOXICILLIN 500 MG PO CAPS: 500.0000 mg | ORAL_CAPSULE | Freq: Three times a day (TID) | ORAL | 0 refills | Status: DC

## 2021-08-10 MED ORDER — NAPROXEN 500 MG PO TABS: 500.0000 mg | ORAL_TABLET | Freq: Two times a day (BID) | ORAL | 0 refills | Status: AC

## 2021-08-10 NOTE — ED Triage Notes (Signed)
Pt is c/o dental pain on the left side  Pt states she has a cracked tooth on that side

## 2021-08-10 NOTE — ED Provider Notes (Signed)
[facility] EMERGENCY DEPARTMENT Provider Note  CSN: [id number] Arrival date & time: 08/10/21 0221    History Chief Complaint  Patient presents with   Dental Pain    Kylie Anderson is a 30 y.o. female presents for evaluation of L lower 3rd molar pain for the last several days. She has put some temporary dental cement into the tooth where she noticed a hole. No fever.    Past Medical History:  Diagnosis Date   Chest pain    Dizziness    Fungal infection of toenail    Hip pain    SOB (shortness of breath)    Toe pain     History reviewed. No pertinent surgical history.  History reviewed. No pertinent family history.  Social History   Tobacco Use   Smoking status: Some Days    Types: Cigars   Smokeless tobacco: Never  Vaping Use   Vaping Use: Some days   Substances: Nicotine, Flavoring  Substance Use Topics   Alcohol use: Yes    Comment: occasionally   Drug use: Not Currently    Frequency: 1.0 times per week    Types: Marijuana     Home Medications Prior to Admission medications   Medication Sig Start Date End Date Taking? Authorizing Provider  amoxicillin (AMOXIL) 500 MG capsule Take 1 capsule (500 mg total) by mouth 3 (three) times Kylie. 08/10/21  Yes Pollyann Savoy, MD  naproxen (NAPROSYN) 500 MG tablet Take 1 tablet (500 mg total) by mouth 2 (two) times Kylie. 08/10/21  Yes Pollyann Savoy, MD  acetaminophen (TYLENOL) 500 MG tablet Take 1 tablet (500 mg total) by mouth every 6 (six) hours as needed. Patient not taking: Reported on 01/26/2020 08/08/18   Emi Holes, PA-C  Chlorphen-Pseudoephed-APAP [facility] FLU/COLD PO) Take 2 capsules by mouth Kylie as needed (cold symptoms).    [provider]  ibuprofen (ADVIL,MOTRIN) 800 MG tablet Take 1 tablet (800 mg total) by mouth every 6 (six) hours as needed. Patient not taking: Reported on 01/26/2020 08/08/18   Emi Holes, PA-C  indomethacin (INDOCIN) 50 MG capsule Take 1  capsule (50 mg total) by mouth 2 (two) times Kylie with a meal. Patient not taking: Reported on 08/08/2018 09/21/17   Roxy Horseman, PA-C  methocarbamol (ROBAXIN) 500 MG tablet Take 1 tablet (500 mg total) by mouth 2 (two) times Kylie. Patient not taking: Reported on 01/26/2020 08/08/18   Emi Holes, PA-C     Allergies    Patient has no known allergies.   Review of Systems   Review of Systems A comprehensive review of systems was completed and negative except as noted in HPI.    Physical Exam BP 127/87 (BP Location: Left Arm)   Pulse 72   Temp 98.9 F (37.2 C) (Oral)   Resp 16   Ht 5\' 4"  (1.626 m)   Wt 77.6 kg   SpO2 100%   BMI 29.35 kg/m   Physical Exam Vitals and nursing note reviewed.  HENT:     Head: Normocephalic.     Nose: Nose normal.     Mouth/Throat:     Comments: Mild pericoronitis around the L lower wisdom tooth, no fluctuance, tooth is tender to palpation.  Eyes:     Extraocular Movements: Extraocular movements intact.  Pulmonary:     Effort: Pulmonary effort is normal.  Musculoskeletal:        General: Normal range of motion.     Cervical back: Neck  supple.  Skin:    Findings: No rash (on exposed skin).  Neurological:     Mental Status: She is alert and oriented to person, place, and time.  Psychiatric:        Mood and Affect: Mood normal.     ED Results / Procedures / Treatments   Labs (all labs ordered are listed, but only abnormal results are displayed) Labs Reviewed - No data to display  EKG None   Radiology No results found.  Procedures Procedures  Medications Ordered in the ED Medications  amoxicillin (AMOXIL) capsule 500 mg (has no administration in time range)  ketorolac (TORADOL) injection 30 mg (has no administration in time range)     MDM Rules/Calculators/A&P MDM  Toradol for pain. Amoxil for pericoronitis. Referral to dental/oral surgery  ED Course  I have reviewed the triage vital signs and the nursing  notes.  Pertinent labs & imaging results that were available during my care of the patient were reviewed by me and considered in my medical decision making (see chart for details).     Final Clinical Impression(s) / ED Diagnoses Final diagnoses:  Pericoronitis    Rx / DC Orders ED Discharge Orders          Ordered    naproxen (NAPROSYN) 500 MG tablet  2 times Kylie        08/10/21 0259    amoxicillin (AMOXIL) 500 MG capsule  3 times Kylie        08/10/21 0259             Pollyann Savoy, MD 08/10/21 [phone]

## 2021-10-14 ENCOUNTER — Emergency Department (HOSPITAL_COMMUNITY)
Admission: EM | Admit: 2021-10-14 | Discharge: 2021-10-14 | Disposition: A | Discharge status: Home / Self Care | Payer: Self-pay | Attending: Student | Admitting: Student

## 2021-10-14 ENCOUNTER — Other Ambulatory Visit: Payer: Self-pay

## 2021-10-14 DIAGNOSIS — K047 Periapical abscess without sinus: Secondary | ICD-10-CM | POA: Insufficient documentation

## 2021-10-14 DIAGNOSIS — K0889 Other specified disorders of teeth and supporting structures: Secondary | ICD-10-CM

## 2021-10-14 MED ORDER — LIDOCAINE VISCOUS HCL 2 % MT SOLN: 15.0000 mL | OROMUCOSAL | 0 refills | Status: DC | PRN

## 2021-10-14 MED ORDER — AMOXICILLIN-POT CLAVULANATE 875-125 MG PO TABS: 1.0000 | ORAL_TABLET | Freq: Two times a day (BID) | ORAL | 0 refills | Status: DC

## 2021-10-14 MED ORDER — HYDROCODONE-ACETAMINOPHEN 5-325 MG PO TABS
1.0000 | ORAL_TABLET | Freq: Once | ORAL | Status: AC
  Administered 2021-10-14: 1 via ORAL
  Filled 2021-10-14: qty 1

## 2021-10-14 MED ORDER — LIDOCAINE VISCOUS HCL 2 % MT SOLN: 15.0000 mL | OROMUCOSAL | 0 refills | Status: AC | PRN

## 2021-10-14 NOTE — Discharge Instructions (Addendum)
Take antibiotics as prescribed.  Take entire course, even if symptoms improve. Use the numbing medicine to help with your pain while the antibiotics kick in. Continue taking the over-the-counter medications that you are taking for pain. Call the dentist below to set up a follow-up appointment.  You may also look at the paperwork about the dentist in the area that may be able to help. Return to the emergency room if you develop high fevers, inability to open up your mouth, inability swallow your own spit, or any new, worsening, or concerning symptoms.

## 2021-10-14 NOTE — ED Provider Notes (Addendum)
[facility] EMERGENCY DEPARTMENT Provider Note   CSN: [id number] Arrival date & time: 10/14/21  2243     History  Chief Complaint  Patient presents with   Dental Pain    Kylie Anderson is a 31 y.o. female presenting for evaluation of dental pain.   Pt states she has had mild pain for a few months of her R upper and lower back teeth. Today pain worsened significantly. One tooth is cracked and the other has a hole. She has not seen a dentist. Has been taking OTC medications without improvement.   HPI     Home Medications Prior to Admission medications   Medication Sig Start Date End Date Taking? Authorizing Provider  acetaminophen (TYLENOL) 500 MG tablet Take 1 tablet (500 mg total) by mouth every 6 (six) hours as needed. Patient not taking: Reported on 01/26/2020 08/08/18   Emi Holes, PA-C  amoxicillin (AMOXIL) 500 MG capsule Take 1 capsule (500 mg total) by mouth 3 (three) times daily. 08/10/21   Pollyann Savoy, MD  amoxicillin-clavulanate (AUGMENTIN) 875-125 MG tablet Take 1 tablet by mouth every 12 (twelve) hours. 10/14/21   Marene Gilliam, PA-C  Chlorphen-Pseudoephed-APAP (THERAFLU FLU/COLD PO) Take 2 capsules by mouth daily as needed (cold symptoms).    [provider]  ibuprofen (ADVIL,MOTRIN) 800 MG tablet Take 1 tablet (800 mg total) by mouth every 6 (six) hours as needed. Patient not taking: Reported on 01/26/2020 08/08/18   Emi Holes, PA-C  indomethacin (INDOCIN) 50 MG capsule Take 1 capsule (50 mg total) by mouth 2 (two) times daily with a meal. Patient not taking: Reported on 08/08/2018 09/21/17   Roxy Horseman, PA-C  lidocaine (XYLOCAINE) 2 % solution Use as directed 15 mLs in the mouth or throat as needed for mouth pain. 10/14/21   Diego Ulbricht, PA-C  methocarbamol (ROBAXIN) 500 MG tablet Take 1 tablet (500 mg total) by mouth 2 (two) times daily. Patient not taking: Reported on 01/26/2020 08/08/18   Emi Holes,  PA-C  naproxen (NAPROSYN) 500 MG tablet Take 1 tablet (500 mg total) by mouth 2 (two) times daily. 08/10/21   Pollyann Savoy, MD      Allergies    Patient has no known allergies.    Review of Systems   Review of Systems  Constitutional:  Negative for fever.  HENT:  Positive for dental problem.    Physical Exam Updated Vital Signs BP 127/83 (BP Location: Left Arm)    Pulse 90    Temp 98.7 F (37.1 C)    Resp 18    SpO2 100%  Physical Exam Vitals and nursing note reviewed.  Constitutional:      General: She is not in acute distress.    Appearance: She is well-developed.  HENT:     Head: Normocephalic and atraumatic.     Mouth/Throat:     Dentition: Abnormal dentition. Dental tenderness present.      Comments: Multiple dental caries and filling. Ttp of the R upper and lower back teeth.  Eyes:     Extraocular Movements: Extraocular movements intact.  Cardiovascular:     Rate and Rhythm: Normal rate.  Pulmonary:     Effort: Pulmonary effort is normal.  Abdominal:     General: There is no distension.  Musculoskeletal:        General: Normal range of motion.     Cervical back: Normal range of motion.  Skin:    General: Skin is warm.  Findings: No rash.  Neurological:     Mental Status: She is alert and oriented to person, place, and time.    ED Results / Procedures / Treatments   Labs (all labs ordered are listed, but only abnormal results are displayed) Labs Reviewed - No data to display  EKG None  Radiology No results found.  Procedures Procedures    Medications Ordered in ED Medications - No data to display  ED Course/ Medical Decision Making/ A&P                           Medical Decision Making   This patient presents to the ED for concern of dental pain. This involves a number of treatment options, and is a complaint that carries with it a low risk of complications and morbidity.  The differential diagnosis includes dental infection, dental  pain, ludwigs, abscess  Dispostion:  After consideration of the diagnostic results and the patients response to treatment, I feel that the patent would benefit from OP abx and pain control with dentistry follow up. Exam not concerning for ludwigs or deep space infection. No ct needed. At this time, pt appears safe for d/c. Return precautions given. Pt states she understands and agrees to plan.   Final Clinical Impression(s) / ED Diagnoses Final diagnoses:  Pain, dental  Dental infection    Rx / DC Orders ED Discharge Orders          Ordered    amoxicillin-clavulanate (AUGMENTIN) 875-125 MG tablet  Every 12 hours,   Status:  Discontinued        10/14/21 2310    lidocaine (XYLOCAINE) 2 % solution  As needed,   Status:  Discontinued        10/14/21 2310    amoxicillin-clavulanate (AUGMENTIN) 875-125 MG tablet  Every 12 hours        10/14/21 2311    lidocaine (XYLOCAINE) 2 % solution  As needed        10/14/21 2311              Maicee Ullman, PA-C 10/14/21 2310    Honor Frison, PA-C 10/14/21 2312    Vanetta Mulders, MD 10/21/21 [phone]

## 2023-08-02 ENCOUNTER — Encounter (HOSPITAL_COMMUNITY): Payer: Self-pay

## 2023-08-02 ENCOUNTER — Ambulatory Visit (HOSPITAL_COMMUNITY)
Admission: EM | Admit: 2023-08-02 | Discharge: 2023-08-02 | Disposition: A | Discharge status: Home / Self Care | Payer: Self-pay | Attending: Sports Medicine | Admitting: Sports Medicine

## 2023-08-02 DIAGNOSIS — K0889 Other specified disorders of teeth and supporting structures: Secondary | ICD-10-CM

## 2023-08-02 DIAGNOSIS — K047 Periapical abscess without sinus: Secondary | ICD-10-CM

## 2023-08-02 MED ORDER — AMOXICILLIN-POT CLAVULANATE 875-125 MG PO TABS: 1.0000 | ORAL_TABLET | Freq: Two times a day (BID) | ORAL | 0 refills | Status: AC

## 2023-08-02 NOTE — ED Triage Notes (Signed)
Pt states left upper dental pain for the past week.  States she has been taking ibuprofen at home with some relief.

## 2023-08-02 NOTE — Discharge Instructions (Addendum)
You have infection of one of the lower molars. I have sent you antibiotics (Augmentin) to take twice daily. Please complete the full course of the antibiotic. Continue tylenol and ibuprofen for pain.   I recommend you follow-up with a dentist as these infections will recur until the root problem with the tooth is addressed.

## 2023-08-02 NOTE — ED Provider Notes (Signed)
[facility]    CSN: [id number] Arrival date & time: 08/02/23  1049      History   Chief Complaint Chief Complaint  Patient presents with   Dental Pain    HPI Kylie Anderson is a 32 y.o. female.   She is here for evaluation of left-sided dental pain.  She states that she has had mild pain for the past 2 weeks however over the past 4 days it has become more exquisite.  It is located on the lower back teeth.  She has had multiple issues with dental infections in the past.  She does not follow routinely with a dentist.  She has been managing her pain with Tylenol and ibuprofen with mild improvement.  She is that this feels similar to her previous infections.  Dental Pain Associated symptoms: no congestion and no drooling     Past Medical History:  Diagnosis Date   Chest pain    Dizziness    Fungal infection of toenail    Hip pain    SOB (shortness of breath)    Toe pain     There are no problems to display for this patient.   History reviewed. No pertinent surgical history.  OB History   No obstetric history on file.      Home Medications    Prior to Admission medications   Medication Sig Start Date End Date Taking? Authorizing Provider  amoxicillin-clavulanate (AUGMENTIN) 875-125 MG tablet Take 1 tablet by mouth every 12 (twelve) hours. 08/02/23  Yes Marisa Cyphers, MD  acetaminophen (TYLENOL) 500 MG tablet Take 1 tablet (500 mg total) by mouth every 6 (six) hours as needed. Patient not taking: Reported on 01/26/2020 08/08/18   Emi Holes, PA-C  Chlorphen-Pseudoephed-APAP [facility] FLU/COLD PO) Take 2 capsules by mouth daily as needed (cold symptoms).    [provider]  ibuprofen (ADVIL,MOTRIN) 800 MG tablet Take 1 tablet (800 mg total) by mouth every 6 (six) hours as needed. Patient not taking: Reported on 01/26/2020 08/08/18   Emi Holes, PA-C  indomethacin (INDOCIN) 50 MG capsule Take 1 capsule (50 mg total) by mouth 2  (two) times daily with a meal. Patient not taking: Reported on 08/08/2018 09/21/17   Roxy Horseman, PA-C  lidocaine (XYLOCAINE) 2 % solution Use as directed 15 mLs in the mouth or throat as needed for mouth pain. 10/14/21   Caccavale, Sophia, PA-C  methocarbamol (ROBAXIN) 500 MG tablet Take 1 tablet (500 mg total) by mouth 2 (two) times daily. Patient not taking: Reported on 01/26/2020 08/08/18   Emi Holes, PA-C  naproxen (NAPROSYN) 500 MG tablet Take 1 tablet (500 mg total) by mouth 2 (two) times daily. 08/10/21   Pollyann Savoy, MD    Family History History reviewed. No pertinent family history.  Social History Social History   Tobacco Use   Smoking status: Some Days    Types: Cigars   Smokeless tobacco: Never  Vaping Use   Vaping status: Some Days   Substances: Nicotine, Flavoring  Substance Use Topics   Alcohol use: Yes    Comment: occasionally   Drug use: Not Currently    Frequency: 1.0 times per week    Types: Marijuana     Allergies   Patient has no known allergies.   Review of Systems Review of Systems  HENT:  Positive for dental problem. Negative for congestion, drooling, sore throat, trouble swallowing and voice change.      Physical Exam Triage  Vital Signs ED Triage Vitals  Encounter Vitals Group     BP 08/02/23 1125 116/76     Systolic BP Percentile --      Diastolic BP Percentile --      Pulse Rate 08/02/23 1125 86     Resp 08/02/23 1125 16     Temp 08/02/23 1125 98.4 F (36.9 C)     Temp Source 08/02/23 1125 Oral     SpO2 08/02/23 1125 98 %     Weight --      Height --      Head Circumference --      Peak Flow --      Pain Score 08/02/23 1127 9     Pain Loc --      Pain Education --      Exclude from Growth Chart --    No data found.  Updated Vital Signs BP 116/76 (BP Location: Left Arm)   Pulse 86   Temp 98.4 F (36.9 C) (Oral)   Resp 16   LMP 07/28/2023 (Approximate)   SpO2 98%   Visual Acuity Right Eye Distance:    Left Eye Distance:   Bilateral Distance:    Right Eye Near:   Left Eye Near:    Bilateral Near:     Physical Exam Constitutional:      General: She is not in acute distress.    Appearance: Normal appearance. She is not ill-appearing or diaphoretic.  HENT:     Head: Normocephalic and atraumatic.     Mouth/Throat:     Mouth: Mucous membranes are moist. No oral lesions.     Dentition: Abnormal dentition. Dental tenderness and dental caries present. No gingival swelling or dental abscesses.     Tongue: No lesions. Tongue does not deviate from midline.     Pharynx: Oropharynx is clear. Uvula midline. No oropharyngeal exudate or uvula swelling.     Tonsils: No tonsillar exudate or tonsillar abscesses.      Comments: Multiple dental caries noted. Mild pain with palpation through her cheek though no significant swelling noted in the soft tissue of her cheeks. Neck:     Trachea: Trachea and phonation normal.  Musculoskeletal:     Cervical back: Normal range of motion and neck supple. No edema, erythema, rigidity or crepitus.  Lymphadenopathy:     Cervical: No cervical adenopathy.  Neurological:     Mental Status: She is alert.      UC Treatments / Results  Labs (all labs ordered are listed, but only abnormal results are displayed) Labs Reviewed - No data to display  EKG   Radiology No results found.  Procedures Procedures (including critical care time)  Medications Ordered in UC Medications - No data to display  Initial Impression / Assessment and Plan / UC Course  I have reviewed the triage vital signs and the nursing notes.  Pertinent labs & imaging results that were available during my care of the patient were reviewed by me and considered in my medical decision making (see chart for details).    Vitals and triage reviewed, patient is hemodynamically stable.  She has tried managing this over the past couple weeks with over-the-counter medications with some mild  improvement though significantly worsening over the past few days.  I suspect that she has developed an underlying infection of her posterior lower 2 molars.  Patient would benefit from outpatient antibiotic treatment and pain control with continuation of Tylenol ibuprofen.  Advised that she follow-up with  dentistry for definitive management.  Prescription sent for Augmentin twice daily x 7 days.  No concerning signs and symptoms of dental abscess or deep space infection.  Patient's questions were answered and she is in agreement with this plan.  Return precautions reviewed with the patient.  Final Clinical Impressions(s) / UC Diagnoses   Final diagnoses:  Pain, dental  Dental infection     Discharge Instructions      You have infection of one of the lower molars. I have sent you antibiotics (Augmentin) to take twice daily. Please complete the full course of the antibiotic. Continue tylenol and ibuprofen for pain.   I recommend you follow-up with a dentist as these infections will recur until the root problem with the tooth is addressed.     ED Prescriptions     Medication Sig Dispense Auth. Provider   amoxicillin-clavulanate (AUGMENTIN) 875-125 MG tablet Take 1 tablet by mouth every 12 (twelve) hours. 14 tablet Marisa Cyphers, MD      PDMP not reviewed this encounter.   Marisa Cyphers, MD 08/02/23 [phone]
